# Patient Record
Sex: Female | Born: 1980
Health system: Southern US, Community
[De-identification: ages and names within clinical notes are randomized; demographics above are authoritative.]

## PROBLEM LIST (undated history)

## (undated) DIAGNOSIS — R87629 Unspecified abnormal cytological findings in specimens from vagina: Secondary | ICD-10-CM

## (undated) DIAGNOSIS — N6320 Unspecified lump in the left breast, unspecified quadrant: Secondary | ICD-10-CM

## (undated) DIAGNOSIS — J302 Other seasonal allergic rhinitis: Secondary | ICD-10-CM

## (undated) DIAGNOSIS — R002 Palpitations: Secondary | ICD-10-CM

## (undated) DIAGNOSIS — F32A Depression, unspecified: Secondary | ICD-10-CM

## (undated) DIAGNOSIS — N809 Endometriosis, unspecified: Secondary | ICD-10-CM

## (undated) DIAGNOSIS — R931 Abnormal findings on diagnostic imaging of heart and coronary circulation: Secondary | ICD-10-CM

## (undated) DIAGNOSIS — R000 Tachycardia, unspecified: Secondary | ICD-10-CM

## (undated) DIAGNOSIS — F410 Panic disorder [episodic paroxysmal anxiety] without agoraphobia: Secondary | ICD-10-CM

## (undated) DIAGNOSIS — F329 Major depressive disorder, single episode, unspecified: Secondary | ICD-10-CM

## (undated) DIAGNOSIS — IMO0002 Reserved for concepts with insufficient information to code with codable children: Secondary | ICD-10-CM

## (undated) DIAGNOSIS — R87619 Unspecified abnormal cytological findings in specimens from cervix uteri: Secondary | ICD-10-CM

## (undated) HISTORY — DX: Tachycardia, unspecified: R00.0

## (undated) HISTORY — DX: Other seasonal allergic rhinitis: J30.2

## (undated) HISTORY — DX: Reserved for concepts with insufficient information to code with codable children: IMO0002

## (undated) HISTORY — DX: Unspecified abnormal cytological findings in specimens from cervix uteri: R87.619

## (undated) HISTORY — DX: Unspecified lump in the left breast, unspecified quadrant: N63.20

## (undated) HISTORY — DX: Major depressive disorder, single episode, unspecified: F32.9

## (undated) HISTORY — DX: Abnormal findings on diagnostic imaging of heart and coronary circulation: R93.1

## (undated) HISTORY — DX: Palpitations: R00.2

## (undated) HISTORY — DX: Depression, unspecified: F32.A

## (undated) HISTORY — DX: Endometriosis, unspecified: N80.9

## (undated) HISTORY — DX: Unspecified abnormal cytological findings in specimens from vagina: R87.629

## (undated) HISTORY — DX: Panic disorder (episodic paroxysmal anxiety): F41.0

---
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
## 2004-09-21 ENCOUNTER — Ambulatory Visit: Payer: Self-pay | Admitting: Family Medicine

## 2004-10-29 ENCOUNTER — Ambulatory Visit: Payer: Self-pay | Admitting: Family Medicine

## 2005-05-22 ENCOUNTER — Other Ambulatory Visit: Admission: RE | Admit: 2005-05-22 | Discharge: 2005-05-22 | Payer: Self-pay | Admitting: Internal Medicine

## 2005-05-22 ENCOUNTER — Encounter (INDEPENDENT_AMBULATORY_CARE_PROVIDER_SITE_OTHER): Payer: Self-pay | Admitting: *Deleted

## 2005-05-22 ENCOUNTER — Ambulatory Visit: Payer: Self-pay | Admitting: Family Medicine

## 2005-11-01 ENCOUNTER — Ambulatory Visit: Payer: Self-pay | Admitting: Family Medicine

## 2007-01-08 HISTORY — PX: LAPAROSCOPIC ENDOMETRIOSIS FULGURATION: SUR769

## 2007-03-12 ENCOUNTER — Ambulatory Visit: Payer: Self-pay | Admitting: Family Medicine

## 2007-03-12 DIAGNOSIS — H1030 Unspecified acute conjunctivitis, unspecified eye: Secondary | ICD-10-CM | POA: Insufficient documentation

## 2007-06-02 ENCOUNTER — Ambulatory Visit (HOSPITAL_COMMUNITY): Admission: RE | Admit: 2007-06-02 | Discharge: 2007-06-02 | Payer: Self-pay | Admitting: Obstetrics and Gynecology

## 2008-10-03 ENCOUNTER — Encounter: Payer: Self-pay | Admitting: Cardiology

## 2008-10-05 ENCOUNTER — Other Ambulatory Visit: Admission: RE | Admit: 2008-10-05 | Discharge: 2008-10-05 | Payer: Self-pay | Admitting: Obstetrics and Gynecology

## 2008-11-14 ENCOUNTER — Encounter: Payer: Self-pay | Admitting: Cardiology

## 2008-12-07 DIAGNOSIS — R931 Abnormal findings on diagnostic imaging of heart and coronary circulation: Secondary | ICD-10-CM

## 2008-12-07 HISTORY — DX: Abnormal findings on diagnostic imaging of heart and coronary circulation: R93.1

## 2008-12-28 ENCOUNTER — Ambulatory Visit: Payer: Self-pay | Admitting: Cardiology

## 2008-12-28 DIAGNOSIS — F411 Generalized anxiety disorder: Secondary | ICD-10-CM | POA: Insufficient documentation

## 2008-12-28 DIAGNOSIS — R002 Palpitations: Secondary | ICD-10-CM | POA: Insufficient documentation

## 2008-12-28 DIAGNOSIS — F17200 Nicotine dependence, unspecified, uncomplicated: Secondary | ICD-10-CM | POA: Insufficient documentation

## 2009-01-03 ENCOUNTER — Ambulatory Visit: Payer: Self-pay | Admitting: Cardiology

## 2009-01-03 ENCOUNTER — Encounter: Payer: Self-pay | Admitting: Physician Assistant

## 2009-01-04 ENCOUNTER — Encounter (INDEPENDENT_AMBULATORY_CARE_PROVIDER_SITE_OTHER): Payer: Self-pay | Admitting: *Deleted

## 2009-02-03 ENCOUNTER — Encounter: Payer: Self-pay | Admitting: Physician Assistant

## 2009-02-16 ENCOUNTER — Encounter (INDEPENDENT_AMBULATORY_CARE_PROVIDER_SITE_OTHER): Payer: Self-pay | Admitting: *Deleted

## 2009-03-03 ENCOUNTER — Encounter: Payer: Self-pay | Admitting: Cardiology

## 2009-11-22 ENCOUNTER — Other Ambulatory Visit
Admission: RE | Admit: 2009-11-22 | Discharge: 2009-11-22 | Payer: Self-pay | Source: Home / Self Care | Admitting: Obstetrics and Gynecology

## 2010-01-07 HISTORY — PX: LEEP: SHX91

## 2010-01-10 ENCOUNTER — Ambulatory Visit (HOSPITAL_COMMUNITY)
Admission: RE | Admit: 2010-01-10 | Discharge: 2010-01-10 | Payer: Self-pay | Source: Home / Self Care | Attending: Obstetrics & Gynecology | Admitting: Obstetrics & Gynecology

## 2010-02-06 NOTE — Miscellaneous (Signed)
  Clinical Lists Changes  Problems: Removed problem of ATRIAL FIBRILLATION, PAROXYSMAL (ICD-427.31) Removed problem of * FLECAINIDE RX Removed problem of PACEMAKER, PERMANENT (ICD-V45.01) Observations: Added new observation of PAST MED HX: Palpitations    event recorder.Marland Kitchen.01/2009...NSR...sinus tachy Depression w/ panic attacks left breast mass EF 55-60%....echo...12/2008   (03/03/2009 11:58) Added new observation of PRIMARY MD: Donzetta Sprung, MD (03/03/2009 11:58)       Past History:  Past Medical History: Palpitations    event recorder.Marland Kitchen.01/2009...NSR...sinus tachy Depression w/ panic attacks left breast mass EF 55-60%....echo...12/2008

## 2010-02-06 NOTE — Procedures (Signed)
Summary: PDS Event Monitor-Final Summary  PDS Event Monitor-Final Summary   Imported By: Cyril Loosen, RN, BSN 02/15/2009 10:29:44  _____________________________________________________________________  External Attachment:    Type:   Image     Comment:   External Document  Appended Document: PDS Event Monitor-Final Summary Pt notified of results by letter.

## 2010-02-06 NOTE — Miscellaneous (Signed)
  Clinical Lists Changes  Problems: Added new problem of ATRIAL FIBRILLATION, PAROXYSMAL (ICD-427.31) Added new problem of * FLECAINIDE RX Added new problem of PACEMAKER, PERMANENT (ICD-V45.01) Observations: Added new observation of PAST MED HX: Palpitations Depression w/ panic attacks left breast mass Atrial fibrillation Brady-tachy Pacemaker  permanent Flecainide Rx EF 60%...echo...11/2005 Nuclear    2004...Marland Kitchenno scar or ischemia....EF   75%  (03/03/2009 11:43) Added new observation of PRIMARY MD: Donzetta Sprung, MD (03/03/2009 11:43)       Past History:  Past Medical History: Palpitations Depression w/ panic attacks left breast mass Atrial fibrillation Brady-tachy Pacemaker  permanent Flecainide Rx EF 60%...echo...11/2005 Nuclear    2004...Marland Kitchenno scar or ischemia....EF   75%

## 2010-02-06 NOTE — Letter (Signed)
Summary: Engineer, materials at [facility]  [address]   [place], [place] [address]   Phone: [phone]  Fax: [phone]        February 16, 2009 MRN: [id number]    Madison Golden [address] [place], [place]  [address]    Dear Ms. [name],  Your test ordered by Selena Batten has been reviewed by your physician (or physician assistant) and was found to be normal or stable. Your physician (or physician assistant) felt no changes were needed at this time.  ____ Echocardiogram  ____ Cardiac Stress Test  ____ Lab Work  ____ Peripheral vascular study of arms, legs or neck  ____ CT scan or X-ray  ____ Lung or Breathing test  __X__ Other: Heart Monitor   Thank you.   Cyril Loosen, RN, BSN    Duane Boston, M.D., F.A.C.C. Thressa Sheller, M.D., F.A.C.C. Oneal Grout, M.D., F.A.C.C. Cheree Ditto, M.D., F.A.C.C. Daiva Nakayama, M.D., F.A.C.C. Kenney Houseman, M.D., F.A.C.C. Jeanne Ivan, PA-C

## 2010-03-19 LAB — COMPREHENSIVE METABOLIC PANEL
ALT: 20 U/L (ref 0–35)
AST: 20 U/L (ref 0–37)
Albumin: 4 g/dL (ref 3.5–5.2)
Alkaline Phosphatase: 51 U/L (ref 39–117)
BUN: 15 mg/dL (ref 6–23)
CO2: 25 mEq/L (ref 19–32)
Calcium: 9.2 mg/dL (ref 8.4–10.5)
Chloride: 105 mEq/L (ref 96–112)
Creatinine, Ser: 0.82 mg/dL (ref 0.4–1.2)
GFR calc Af Amer: >60 mL/min (ref >60)
GFR calc non Af Amer: >60 mL/min (ref >60)
Glucose, Bld: 82 mg/dL (ref 70–99)
Potassium: 4.2 mEq/L (ref 3.5–5.1)
Sodium: 138 mEq/L (ref 135–145)
Total Bilirubin: 0.7 mg/dL (ref 0.3–1.2)
Total Protein: 6.9 g/dL (ref 6.0–8.3)

## 2010-03-19 LAB — URINALYSIS, ROUTINE W REFLEX MICROSCOPIC
Bilirubin Urine: NEGATIVE
Glucose, UA: NEGATIVE mg/dL
Hgb urine dipstick: NEGATIVE
Ketones, ur: NEGATIVE mg/dL
Nitrite: NEGATIVE
Protein, ur: NEGATIVE mg/dL
Specific Gravity, Urine: 1.03 (ref 1.005–1.030)
Urobilinogen, UA: 0.2 mg/dL (ref 0.0–1.0)
pH: 5.5 (ref 5.0–8.0)

## 2010-03-19 LAB — HCG, QUANTITATIVE, PREGNANCY: hCG, Beta Chain, Quant, S: <2 m[IU]/mL (ref <5)

## 2010-03-19 LAB — CBC
HCT: 35.5 % — ABNORMAL LOW (ref 36.0–46.0)
Hemoglobin: 12.2 g/dL (ref 12.0–15.0)
MCH: 30 pg (ref 26.0–34.0)
MCHC: 34.4 g/dL (ref 30.0–36.0)
MCV: 87.4 fL (ref 78.0–100.0)
Platelets: 178 10*3/uL (ref 150–400)
RBC: 4.06 MIL/uL (ref 3.87–5.11)
RDW: 13.2 % (ref 11.5–15.5)
WBC: 5.2 10*3/uL (ref 4.0–10.5)

## 2010-03-19 LAB — SURGICAL PCR SCREEN
MRSA, PCR: NEGATIVE
Staphylococcus aureus: NEGATIVE

## 2010-05-22 NOTE — Op Note (Signed)
NAME:  Madison Golden, Madison Golden NO.:  [id number]   MEDICAL RECORD NO.:  [id number]          PATIENT TYPE:  AMB   LOCATION:  SDC                           FACILITY:  [facility]   PHYSICIAN:  Zenaida Niece, M.D.DATE OF BIRTH:  1980-05-03   DATE OF PROCEDURE:  06/02/2007  DATE OF DISCHARGE:                               OPERATIVE REPORT   PREOPERATIVE DIAGNOSIS:  Pelvic pain.   POSTOPERATIVE DIAGNOSES:  Pelvic pain and probable endometriosis.   PROCEDURES:  Laparoscopy with fulguration of probable endometriosis.   SURGEON:  Zenaida Niece, MD   ANESTHESIA:  General endotracheal tube.   FINDINGS:  She had normal anatomy in the pelvis and upper abdomen except  for some lesions and both ovarian fossae on the left greater than the  right consistent with endometriosis.   SPECIMENS:  None.   ESTIMATED BLOOD LOSS:  Minimal.   COMPLICATIONS:  None.   PROCEDURE IN DETAIL:  The patient was taken to the operating room and  placed in the dorsal supine position.  General anesthesia was induced,  and she was placed in mobile stirrups and left arm tucked to her side.  Abdomen was prepped and draped in the usual sterile fashion, bladder  drained with a latex-free catheter and a Hulka tenaculum applied to the  cervix for uterine manipulation.  Infraumbilical skin was then  infiltrated with 0.25% Marcaine and a three-quarter centimeter vertical  incision was made.  The Veress needle was inserted into the peritoneal  cavity and placement confirmed by the water drop test and opening  pressure of 5 mmHg.  CO2 gas was insufflated to a pressure of 14 mmHg,  and the Veress needle was removed.  A 5-mm laparoscope was then  introduced with direct visualization with a disposable trocar.  A 5-mm  port was then also placed on the left side under direct visualization.  Inspection revealed the above-mentioned findings.  Appendix,  gallbladder, liver, and stomach appeared normal.  The uterus,  tubes, and  ovaries were essentially normal.  There were a couple small clear  implants in the right ovarian fossa just superior to the uterosacral  ligament.  There was a small adhesion in the left ovarian fossa and some  clear and dark lesions consistent with endometriosis.  Ureters were  identified inferior to these lesions.  All visible lesions were then  fulgurated with bipolar cautery.  No further source for her pain was  identified.  The  5-mm trocar on the left was removed under direct visualization.  All gas  was allowed to deflate from the abdomen and the umbilical trocar was  then removed.  Skin incisions were closed with interrupted subcuticular  sutures of 4-0  Vicryl followed by Dermabond.  The Hulka tenaculum was then removed.  The patient tolerated the procedure well.  She was extubated in the  operating room and taken to the recovery room in stable condition.  Counts were correct x2, and she had PAS hose on throughout the  procedure.      Zenaida Niece, M.D.  Electronically Signed     TDM/MEDQ  D:  06/02/2007  T:  06/03/2007  Job:  [id number]

## 2010-05-25 NOTE — Discharge Summary (Signed)
[facility]  Patient:    Madison Golden, Madison Golden Visit Number: [id number] MRN: [id number]          Service Type: ANT Location: [facility] Attending Physician:  Malon Kindle Dictated by:   Malachi Pro. Ambrose Mantle, M.D. Admit Date:  03/20/2001 Disc. Date: 04/09/01                             Discharge Summary  HISTORY OF PRESENT ILLNESS:   A 30 year old black female para 0, gravida 1, 37 weeks and 4 days by nine-week ultrasound with [facility] April 22, 2001, presented complaining of regular contractions.  No vaginal bleeding or ruptured membranes.  Good fetal movement.  Vaginal exam in maternity admission unit by the RN showed the cervix to be 5 to 6 cm dilated.  The patient was admitted and received an epidural.  Blood group and type O positive with a negative antibody, RPR nonreactive, rubella immune, hepatitis B surface antigen negative.  GC and chlamydia negative.  Triple screen normal.  One-hour glucola 73.  Group B strep positive.  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  Ultrasound on March 20, 2001, for size less than dates showed an estimated fetal weight of about 2400 g, AFI 11.0.  PAST MEDICAL AND SURGICAL HISTORY: Negative.  ALLERGIES:                    No known drug allergies.  MEDICATIONS:                  None.  SOCIAL HISTORY:               The patient is single.  The father of the baby was present, no tobacco.  FAMILY HISTORY:               Negative.  HOSPITAL COURSE:              On admission, she was afebrile with normal vital signs.  Fetal heart tones were reactive with contractions every 2 to 4 minutes.  Abdomen was gravid and nontender.  Estimated fetal weight about 6-1/2 pounds.  Vaginal exam by Dr. Jackelyn Knife at 7:45 a.m. showed cervix 7 cm, 80%, vertex, 0 station.  Artificial rupture of membranes produced no fluid. The patient was treated with penicillin for positive group B strep.  By 9 a.m.,  contractions every 2 to 3 minutes, cervix 9 cm, 100%, some bloody show, vertex, +1 station.  The patient progressed to full dilatation and pushed well.  She delivered spontaneously at 10:43 a.m. by Dr. Ambrose Mantle a living female infant, 5 pounds 14 ounces.  The delivery was OA.  Apgars 9 at 1 and 9 at 5 minutes.  Procedure was done over an intact perineum with bilateral labial lacerations.  The labial lacerations were hemostatic and not sutured. The placenta was intact.  Uterus was normal.  Blood loss was about 400 cc.  Postpartum, the patient did well, and on the second postpartum day declined analgesics for pain and was discharge.  Initial hemoglobin 10.7, hematocrit 31.0, white count 13,900, platelet count 196,000.  Followup hemoglobin 9.7, hematocrit 28.4, white count 13,700, platelet count 156,000.  RPR was nonreactive.  FINAL DIAGNOSIS:              1. Intrauterine pregnancy at 37+ weeks.  2. Group B Streptococcus carrier.                               3. Borderline small-for-gestational-age infant.  OPERATIONS:                   Spontaneous delivery OA.  FINAL CONDITION:              Improved.  DIET:                         Regular.  DISCHARGE INSTRUCTIONS:       No vaginal entrance.  Call with any fever above 100.4 degrees.  Call with any unusual problems.  Follow our discharge instruction booklet, and return to the office in six weeks for followup examination. Dictated by:   Malachi Pro. Ambrose Mantle, M.D. Attending Physician:  Malon Kindle DD:  04/09/01 TD:  04/09/01 Job: [id number]

## 2010-07-03 ENCOUNTER — Encounter: Payer: Self-pay | Admitting: Physician Assistant

## 2010-07-16 ENCOUNTER — Other Ambulatory Visit: Payer: Self-pay | Admitting: Obstetrics & Gynecology

## 2010-07-16 ENCOUNTER — Other Ambulatory Visit (HOSPITAL_COMMUNITY)
Admission: RE | Admit: 2010-07-16 | Discharge: 2010-07-16 | Disposition: A | Discharge status: Home / Self Care | Payer: Commercial Indemnity | Source: Ambulatory Visit | Attending: Obstetrics & Gynecology | Admitting: Obstetrics & Gynecology

## 2010-07-16 DIAGNOSIS — Z01419 Encounter for gynecological examination (general) (routine) without abnormal findings: Secondary | ICD-10-CM | POA: Insufficient documentation

## 2010-10-03 LAB — CBC
HCT: 38.5
Hemoglobin: 13.5
MCHC: 35.1
MCV: 89.4
Platelets: 162
RBC: 4.31
RDW: 12.2
WBC: 6.9

## 2010-10-03 LAB — PREGNANCY, URINE: Preg Test, Ur: NEGATIVE

## 2011-01-18 ENCOUNTER — Other Ambulatory Visit (HOSPITAL_COMMUNITY)
Admission: RE | Admit: 2011-01-18 | Discharge: 2011-01-18 | Disposition: A | Discharge status: Home / Self Care | Payer: Commercial Indemnity | Source: Ambulatory Visit | Attending: Obstetrics & Gynecology | Admitting: Obstetrics & Gynecology

## 2011-01-18 ENCOUNTER — Other Ambulatory Visit: Payer: Self-pay | Admitting: Obstetrics & Gynecology

## 2011-01-18 DIAGNOSIS — Z01419 Encounter for gynecological examination (general) (routine) without abnormal findings: Secondary | ICD-10-CM | POA: Insufficient documentation

## 2012-01-08 NOTE — L&D Delivery Note (Signed)
Delivery Note At 10:25 PM a viable female was delivered via Vaginal, Spontaneous Delivery (Presentation: ; Occiput Anterior).  APGAR: 8, 9; weight 7 lb 14.5 oz (3586 g).   Placenta status: Intact, Spontaneous.  Cord:  with the following complications: None.  Cord pH: NA  Anesthesia: Local  Episiotomy: None Lacerations: 2nd degree;Perineal Suture Repair: 3.0 vicryl Est. Blood Loss (mL): 300  Mom to postpartum.  Baby to Nursery. Placenta to: BS Feeding: Breast Circ: NA Contraception: BTL   Doyt Castellana 11/10/2012, 1:43 AM

## 2012-01-24 ENCOUNTER — Other Ambulatory Visit: Payer: Self-pay | Admitting: Adult Health

## 2012-01-24 ENCOUNTER — Other Ambulatory Visit (HOSPITAL_COMMUNITY)
Admission: RE | Admit: 2012-01-24 | Discharge: 2012-01-24 | Disposition: A | Discharge status: Home / Self Care | Payer: 59 | Source: Ambulatory Visit | Attending: Obstetrics and Gynecology | Admitting: Obstetrics and Gynecology

## 2012-01-24 DIAGNOSIS — Z1151 Encounter for screening for human papillomavirus (HPV): Secondary | ICD-10-CM | POA: Insufficient documentation

## 2012-01-24 DIAGNOSIS — Z01419 Encounter for gynecological examination (general) (routine) without abnormal findings: Secondary | ICD-10-CM | POA: Insufficient documentation

## 2012-01-24 LAB — HM PAP SMEAR: HM Pap smear: NEGATIVE

## 2012-03-16 ENCOUNTER — Other Ambulatory Visit: Payer: Self-pay | Admitting: Obstetrics & Gynecology

## 2012-03-16 DIAGNOSIS — Z1389 Encounter for screening for other disorder: Secondary | ICD-10-CM

## 2012-03-18 ENCOUNTER — Encounter: Payer: Self-pay | Admitting: *Deleted

## 2012-03-25 ENCOUNTER — Ambulatory Visit (INDEPENDENT_AMBULATORY_CARE_PROVIDER_SITE_OTHER): Payer: 59

## 2012-03-25 ENCOUNTER — Encounter: Payer: Self-pay | Admitting: Obstetrics & Gynecology

## 2012-03-25 ENCOUNTER — Other Ambulatory Visit: Payer: Self-pay | Admitting: Obstetrics & Gynecology

## 2012-03-25 DIAGNOSIS — O26849 Uterine size-date discrepancy, unspecified trimester: Secondary | ICD-10-CM

## 2012-03-25 DIAGNOSIS — Z1389 Encounter for screening for other disorder: Secondary | ICD-10-CM

## 2012-03-25 LAB — US OB COMP LESS 14 WKS

## 2012-04-01 ENCOUNTER — Encounter: Payer: Self-pay | Admitting: Adult Health

## 2012-04-01 ENCOUNTER — Other Ambulatory Visit: Payer: Self-pay | Admitting: Adult Health

## 2012-04-01 ENCOUNTER — Ambulatory Visit (INDEPENDENT_AMBULATORY_CARE_PROVIDER_SITE_OTHER): Payer: 59 | Admitting: Adult Health

## 2012-04-01 VITALS — BP 118/76 | Wt 131.0 lb

## 2012-04-01 DIAGNOSIS — O210 Mild hyperemesis gravidarum: Secondary | ICD-10-CM

## 2012-04-01 DIAGNOSIS — O34219 Maternal care for unspecified type scar from previous cesarean delivery: Secondary | ICD-10-CM

## 2012-04-01 DIAGNOSIS — Z3481 Encounter for supervision of other normal pregnancy, first trimester: Secondary | ICD-10-CM

## 2012-04-01 DIAGNOSIS — O99019 Anemia complicating pregnancy, unspecified trimester: Secondary | ICD-10-CM

## 2012-04-01 DIAGNOSIS — O3440 Maternal care for other abnormalities of cervix, unspecified trimester: Secondary | ICD-10-CM

## 2012-04-01 LAB — CBC
HCT: 35.8 % — ABNORMAL LOW (ref 36.0–46.0)
Hemoglobin: 11.9 g/dL — ABNORMAL LOW (ref 12.0–15.0)
MCH: 28.7 pg (ref 26.0–34.0)
MCHC: 33.2 g/dL (ref 30.0–36.0)
MCV: 86.5 fL (ref 78.0–100.0)
Platelets: 222 10*3/uL (ref 150–400)
RBC: 4.14 MIL/uL (ref 3.87–5.11)
RDW: 13.2 % (ref 11.5–15.5)
WBC: 8.4 10*3/uL (ref 4.0–10.5)

## 2012-04-01 LAB — POCT URINALYSIS DIPSTICK
Blood, UA: NEGATIVE
Glucose, UA: NEGATIVE
Ketones, UA: NEGATIVE
Leukocytes, UA: NEGATIVE
Nitrite, UA: NEGATIVE
Protein, UA: NEGATIVE

## 2012-04-01 NOTE — Patient Instructions (Addendum)
IT/ NT in 4 weeks, Try prunes, fruit and increase fluids for constipation. Can sign up for my chart

## 2012-04-01 NOTE — Progress Notes (Signed)
Lab consent signed. Would like to discuss VBAC and options.

## 2012-04-01 NOTE — Progress Notes (Signed)
Complains of nausea, declines meds,has constipation try prunes, raisins grapes and increase fluids.No bleeding, or discharge some cramps. Will get IT/NT in 4 weeks

## 2012-04-02 ENCOUNTER — Telehealth: Payer: Self-pay | Admitting: Adult Health

## 2012-04-02 LAB — HIV ANTIBODY (ROUTINE TESTING W REFLEX): HIV: NONREACTIVE

## 2012-04-02 LAB — RUBELLA SCREEN: Rubella: 3.66 Index — ABNORMAL HIGH (ref <0.90)

## 2012-04-02 LAB — DRUG SCREEN, URINE, NO CONFIRMATION
Amphetamine Screen, Ur: NEGATIVE
Barbiturate Quant, Ur: NEGATIVE
Benzodiazepines.: NEGATIVE
Cocaine Metabolites: NEGATIVE
Creatinine,U: 82 mg/dL
Marijuana Metabolite: NEGATIVE
Methadone: NEGATIVE
Opiate Screen, Urine: NEGATIVE
Phencyclidine (PCP): NEGATIVE
Propoxyphene: NEGATIVE

## 2012-04-02 LAB — URINALYSIS
Bilirubin Urine: NEGATIVE
Glucose, UA: NEGATIVE mg/dL
Hgb urine dipstick: NEGATIVE
Ketones, ur: NEGATIVE mg/dL
Leukocytes, UA: NEGATIVE
Nitrite: NEGATIVE
Protein, ur: NEGATIVE mg/dL
Specific Gravity, Urine: 1.015 (ref 1.005–1.030)
Urobilinogen, UA: 0.2 mg/dL (ref 0.0–1.0)
pH: 7 (ref 5.0–8.0)

## 2012-04-02 LAB — ABO AND RH: Rh Type: POSITIVE

## 2012-04-02 LAB — HEPATITIS B SURFACE ANTIGEN: Hepatitis B Surface Ag: NEGATIVE

## 2012-04-02 LAB — GC/CHLAMYDIA PROBE AMP
CT Probe RNA: NEGATIVE
GC Probe RNA: NEGATIVE

## 2012-04-02 LAB — VARICELLA ZOSTER ANTIBODY, IGG: Varicella IgG: 315.4 Index — ABNORMAL HIGH (ref <135.00)

## 2012-04-02 LAB — RPR

## 2012-04-02 LAB — SICKLE CELL SCREEN: Sickle Cell Screen: NEGATIVE

## 2012-04-02 LAB — OXYCODONE SCREEN, UA, RFLX CONFIRM: Oxycodone Screen, Ur: NEGATIVE ng/mL

## 2012-04-02 LAB — ANTIBODY SCREEN: Antibody Screen: NEGATIVE

## 2012-04-02 MED ORDER — PROMETHAZINE HCL 25 MG PO TABS: 25.0000 mg | ORAL_TABLET | Freq: Four times a day (QID) | ORAL | Status: DC | PRN

## 2012-04-02 NOTE — Telephone Encounter (Signed)
Madison Golden called stating she is having nausea and vomiting today, wants to try phenergan. It was e-prescribed to [facility]

## 2012-04-03 LAB — URINE CULTURE
Colony Count: NO GROWTH
Organism ID, Bacteria: NO GROWTH

## 2012-04-15 DIAGNOSIS — Z029 Encounter for administrative examinations, unspecified: Secondary | ICD-10-CM

## 2012-04-27 ENCOUNTER — Telehealth: Payer: Self-pay | Admitting: Adult Health

## 2012-04-27 NOTE — Telephone Encounter (Signed)
Spoke with pt. Had sex on Friday pm. Spotting on Saturday. Went to [facility] ER and was told everything was ok. Spotting was brownish/pink; still notices it just with wiping. Advised everything sounds fine at this point but to watch for bleeding to get heavier, to turn red, or cramping to start. If that happens, call us back. Advised no sex for 7 days from the last time she wiped any color. Has a scheduled appt on Wednesday, and I advised her to keep that appt. To call if any problems. Pt voiced understanding.

## 2012-04-29 ENCOUNTER — Ambulatory Visit (INDEPENDENT_AMBULATORY_CARE_PROVIDER_SITE_OTHER): Payer: 59

## 2012-04-29 ENCOUNTER — Ambulatory Visit (INDEPENDENT_AMBULATORY_CARE_PROVIDER_SITE_OTHER): Payer: 59 | Admitting: Advanced Practice Midwife

## 2012-04-29 ENCOUNTER — Other Ambulatory Visit: Payer: Self-pay | Admitting: Advanced Practice Midwife

## 2012-04-29 VITALS — BP 104/60 | Wt 127.0 lb

## 2012-04-29 DIAGNOSIS — O34219 Maternal care for unspecified type scar from previous cesarean delivery: Secondary | ICD-10-CM

## 2012-04-29 DIAGNOSIS — Z9889 Other specified postprocedural states: Secondary | ICD-10-CM

## 2012-04-29 DIAGNOSIS — Z3480 Encounter for supervision of other normal pregnancy, unspecified trimester: Secondary | ICD-10-CM

## 2012-04-29 DIAGNOSIS — O3440 Maternal care for other abnormalities of cervix, unspecified trimester: Secondary | ICD-10-CM

## 2012-04-29 DIAGNOSIS — Z36 Encounter for antenatal screening of mother: Secondary | ICD-10-CM

## 2012-04-29 DIAGNOSIS — O99019 Anemia complicating pregnancy, unspecified trimester: Secondary | ICD-10-CM

## 2012-04-29 DIAGNOSIS — Z3481 Encounter for supervision of other normal pregnancy, first trimester: Secondary | ICD-10-CM

## 2012-04-29 DIAGNOSIS — O0990 Supervision of high risk pregnancy, unspecified, unspecified trimester: Secondary | ICD-10-CM | POA: Insufficient documentation

## 2012-04-29 LAB — POCT URINALYSIS DIPSTICK
Glucose, UA: NEGATIVE
Ketones, UA: NEGATIVE
Nitrite, UA: NEGATIVE
Protein, UA: NEGATIVE

## 2012-04-29 NOTE — Assessment & Plan Note (Signed)
Wants another VBAC

## 2012-04-29 NOTE — Progress Notes (Signed)
Had NT/IT today.  See u/s note below.    No c/o at this time.  Routine questions about pregnancy answered.  F/U in 4 weeks for OBV and 2nd IT.

## 2012-04-29 NOTE — Progress Notes (Signed)
U/S-(11+6wks)-single IUP with +FCA noted CRL c/w dates, NB present, NT = 1.84mm, cx long and closed, bilateral adnexa wnl, no free fluid noted

## 2012-04-29 NOTE — Assessment & Plan Note (Signed)
Clinic:[facility]  Genetic Screen NT:                            First Screen:               Quad Screen/MSAFP:  Anatomic US   Glucose Screen   GBS   Feeding Preference   Contraception   Circumcision       

## 2012-04-29 NOTE — Progress Notes (Signed)
Brownish discharge after sex, seen at [facility] ER 04/25/2012, Pt states husband tested for Cystic Fibrosis at PCP practice and results are negative.

## 2012-05-04 LAB — MATERNAL SCREEN, INTEGRATED #1

## 2012-05-14 ENCOUNTER — Telehealth: Payer: Self-pay | Admitting: Adult Health

## 2012-05-14 NOTE — Telephone Encounter (Signed)
Spoke with pt. Having issues with constipation. [redacted] weeks pregnant. Advised to eat 4 prunes daily or drink prune juice. Also advised to increase water intake, and eat fruits and veg. Can take otc colace if needed. Advised if this don't help, call us back. Pt voiced understanding. JSY

## 2012-05-29 ENCOUNTER — Other Ambulatory Visit: Payer: Self-pay | Admitting: Obstetrics & Gynecology

## 2012-05-29 ENCOUNTER — Ambulatory Visit (INDEPENDENT_AMBULATORY_CARE_PROVIDER_SITE_OTHER): Payer: 59 | Admitting: Obstetrics & Gynecology

## 2012-05-29 ENCOUNTER — Encounter: Payer: Self-pay | Admitting: Obstetrics & Gynecology

## 2012-05-29 VITALS — BP 100/60 | Wt 131.0 lb

## 2012-05-29 DIAGNOSIS — Z1389 Encounter for screening for other disorder: Secondary | ICD-10-CM

## 2012-05-29 DIAGNOSIS — O99019 Anemia complicating pregnancy, unspecified trimester: Secondary | ICD-10-CM

## 2012-05-29 DIAGNOSIS — O34219 Maternal care for unspecified type scar from previous cesarean delivery: Secondary | ICD-10-CM

## 2012-05-29 DIAGNOSIS — O3440 Maternal care for other abnormalities of cervix, unspecified trimester: Secondary | ICD-10-CM

## 2012-05-29 DIAGNOSIS — Z331 Pregnant state, incidental: Secondary | ICD-10-CM

## 2012-05-29 DIAGNOSIS — Z3482 Encounter for supervision of other normal pregnancy, second trimester: Secondary | ICD-10-CM

## 2012-05-29 LAB — POCT URINALYSIS DIPSTICK
Blood, UA: NEGATIVE
Glucose, UA: NEGATIVE
Ketones, UA: NEGATIVE
Nitrite, UA: NEGATIVE
Protein, UA: NEGATIVE

## 2012-05-29 NOTE — Addendum Note (Signed)
Addended by: Criss Alvine on: 05/29/2012 12:19 PM   Modules accepted: Orders

## 2012-05-29 NOTE — Progress Notes (Signed)
BP weight and urine results all reviewed and noted. Patient reports good fetal movement, denies any bleeding and no rupture of membranes symptoms or regular contractions. Patient is without complaints. All questions were answered.  

## 2012-05-29 NOTE — Patient Instructions (Signed)
How a Baby Grows During Pregnancy Pregnancy begins when the female's sperm enters the female's egg. This happens in the fallopian tube and is called fertilization. The fertilized egg is called an embryo until it reaches 9 weeks from the time of fertilization. From 9 weeks until birth it is called a fetus. The fertilized egg moves down the tube into the uterus and attaches to the inside lining of the uterus.  The pregnant woman is responsible for the growth of the embryo/fetus by supplying nourishment and oxygen through the blood stream and placenta to the developing fetus. The uterus becomes larger and pops out from the abdomen more and more as the fetus develops and grows. A normal pregnancy lasts 280 days, with a range of 259 to 294 days, or 40 weeks. The pregnancy is divided up into three trimesters:  First trimester - 0 to 13 weeks.  Second trimester - 14 to 27 weeks.  Third trimester - 28 to 40 weeks. The day your baby is supposed to be born is called estimated date of confinement (EDC) or estimated date of delivery (EDD). GROWTH OF THE BABY MONTH BY MONTH 1. First Month: The fertilized egg attaches to the inside of the uterus and certain cells will form the placenta and others will develop into the fetus. The arms, legs, brain, spinal cord, lungs, and heart begin to develop. At the end of the first month the heart begins to beat. The embryo weighs less than an ounce and is  inch long. 2. Second Month: The bones can be seen, the inner ear, eye lids, hands and feet form and genitals develop. By the end of 8 weeks, all of the major organs are developing. The fetus now weighs less than an ounce and is one inch (2.54 cm) long. 3. Third Month: Teeth buds appear, all the internal organs are forming, bones and muscles begin to grow, the spine can flex and the skin is transparent. Finger and toe nails begin to form, the hands develop faster than the feet and the arms are longer than the legs at this point.  The fetus weighs a little more than an ounce (0.03 kg) and is 3 inches (8.89cm) long. 4. Fourth Month: The placenta is completely formed. The external sex organs, neck, outer ear, eyebrows, eyelids and fingernails are formed. The fetus can hear, swallow, flex its arms and legs and the kidney begins to produce urine. The skin is covered with a white waxy coating (vernix) and very thin hair (lanugo) is present. The fetus weighs 5 ounces (0.14kg) and is 6 to 7 inches (16.51cm) long. 5. Fifth Month: The fetus moves around more and can be felt for the first time (called quickening), sleeps and wakes up at times, may begin to suck its finger and the nails grow to the end of the fingers. The gallbladder is now functioning and helps to digest the nutrients, eggs are formed in the female and the testicles begin to drop down from the abdomen to the scrotum in the female. The fetus weighs  to 1 pound (0.45kg) and is 10 inches (25.4cm) long. 6. Sixth Month: The lungs are formed but the fetus does not breath yet. The eyes open, the brain develops more quickly at this time, one can detect finger and toe prints and thicker hair grows. The fetus weighs 1 to 1 pounds (0.68kg) and is 12 inches (30.48cm) long. 7. Seventh Month: The fetus can hear and respond to sounds, kicks and stretches and can sense   changes in light. The fetus weighs 2 to 2 pounds (1.13kg) and is 14 inches (35.56cm) long. 8. Eight Month: All organs and body systems are fully developed and functioning. The bones get harder, taste buds develop and can taste sweet and sour flavors and the fetus may hiccup now. Different parts of the brain are developing and the skull remains soft for the brain to grow. The fetus weighs 5 pounds (2.27kg) and is 18 inches (45.75cm) long. 9. Ninth Month: The fetus gains about a half a pound a week, the lungs are fully developed, patterns of sleep develop and the head moves down into the bottom of the uterus called vertex. If the  buttocks moves into the bottom of the uterus, it is called a breech. The fetus weighs 6 to 9 pounds (2.72 to 4.08kg) and is 20 inches (50.8cm) long. You should be informed about your pregnancy, yourself and how the baby is developing as much as possible. Being informed helps you to enjoy this experience. It also gives you the sense to feel if something is not going right and when to ask questions. Talk to your caregiver when you have questions about your baby or your own body. Document Released: 06/12/2007 Document Revised: 03/18/2011 Document Reviewed: 06/12/2007 [employer] Patient Information 2014 [employer].  

## 2012-06-04 LAB — MATERNAL SCREEN, INTEGRATED #2
AFP MoM: 0.53
AFP, Serum: 22 ng/mL
Age risk Down Syndrome: 1:700 {titer}
Calculated Gestational Age: 16.3
Crown Rump Length: 55.6 mm
Estriol Mom: 1
Estriol, Free: 0.64 ng/mL
Inhibin A Dimeric: 161 pg/mL
Inhibin A MoM: 0.88
MSS Down Syndrome: 1:2500 {titer}
MSS Trisomy 18 Risk: <1:5000 {titer}
NT MoM: 1.03
Nuchal Translucency: 1.35 mm
Number of fetuses: 1
PAPP-A MoM: 1.22
PAPP-A: 1036 ng/mL
Rish for ONTD: <1:5000 {titer}
hCG MoM: 1.4
hCG, Serum: 37.4 IU/mL

## 2012-06-11 ENCOUNTER — Ambulatory Visit (INDEPENDENT_AMBULATORY_CARE_PROVIDER_SITE_OTHER): Payer: 59 | Admitting: Obstetrics and Gynecology

## 2012-06-11 ENCOUNTER — Encounter: Payer: 59 | Admitting: Obstetrics and Gynecology

## 2012-06-11 ENCOUNTER — Telehealth: Payer: Self-pay | Admitting: Obstetrics and Gynecology

## 2012-06-11 VITALS — BP 112/70 | Wt 135.0 lb

## 2012-06-11 DIAGNOSIS — Z331 Pregnant state, incidental: Secondary | ICD-10-CM

## 2012-06-11 DIAGNOSIS — O99019 Anemia complicating pregnancy, unspecified trimester: Secondary | ICD-10-CM

## 2012-06-11 DIAGNOSIS — N86 Erosion and ectropion of cervix uteri: Secondary | ICD-10-CM

## 2012-06-11 DIAGNOSIS — O34219 Maternal care for unspecified type scar from previous cesarean delivery: Secondary | ICD-10-CM

## 2012-06-11 DIAGNOSIS — Z1389 Encounter for screening for other disorder: Secondary | ICD-10-CM

## 2012-06-11 DIAGNOSIS — O3440 Maternal care for other abnormalities of cervix, unspecified trimester: Secondary | ICD-10-CM

## 2012-06-11 LAB — POCT URINALYSIS DIPSTICK
Glucose, UA: NEGATIVE
Ketones, UA: NEGATIVE
Leukocytes, UA: NEGATIVE
Nitrite, UA: NEGATIVE
Protein, UA: NEGATIVE

## 2012-06-11 NOTE — Patient Instructions (Signed)
Notify us for any post coital bleeding or unexpected spotting with normal activities

## 2012-06-11 NOTE — Telephone Encounter (Signed)
Spoke with pt. Unable to come in at 1:45 but plans on coming in at 3:30. JSY

## 2012-06-11 NOTE — Telephone Encounter (Signed)
Spoke with pt. Having an increase in vaginal discharge, + itching. Unsure if she has a yeast infection. Offered appt today at 1:45. Pt to call back after checking with work to see if she can come today. To call me back and let me know. JSY

## 2012-06-11 NOTE — Progress Notes (Signed)
C/o yellowish discharge with itching, trace of blood in urine Speculum check

## 2012-06-12 ENCOUNTER — Telehealth: Payer: Self-pay | Admitting: Obstetrics & Gynecology

## 2012-06-12 NOTE — Telephone Encounter (Signed)
Pt states having vaginal bleeding (light) since having her cervical check yesterday for vaginal discharge, Informed pt if not resolved with 24 - 72 hours from yesterdays appt to go to [facility] or call our office. Pt asking if needed to give urine sample due to trace of blood showing on urine dipstick? Informed pt would discuss with Dr. Emelda Fear and would follow up with her. Pt verbalized understanding.

## 2012-06-26 ENCOUNTER — Other Ambulatory Visit: Payer: Self-pay | Admitting: Obstetrics & Gynecology

## 2012-06-26 ENCOUNTER — Encounter: Payer: Self-pay | Admitting: Obstetrics & Gynecology

## 2012-06-26 ENCOUNTER — Ambulatory Visit (INDEPENDENT_AMBULATORY_CARE_PROVIDER_SITE_OTHER): Payer: 59 | Admitting: Obstetrics & Gynecology

## 2012-06-26 ENCOUNTER — Ambulatory Visit (INDEPENDENT_AMBULATORY_CARE_PROVIDER_SITE_OTHER): Payer: 59

## 2012-06-26 VITALS — BP 110/80 | Wt 135.0 lb

## 2012-06-26 DIAGNOSIS — O34219 Maternal care for unspecified type scar from previous cesarean delivery: Secondary | ICD-10-CM

## 2012-06-26 DIAGNOSIS — O09299 Supervision of pregnancy with other poor reproductive or obstetric history, unspecified trimester: Secondary | ICD-10-CM

## 2012-06-26 DIAGNOSIS — O3440 Maternal care for other abnormalities of cervix, unspecified trimester: Secondary | ICD-10-CM

## 2012-06-26 DIAGNOSIS — O99019 Anemia complicating pregnancy, unspecified trimester: Secondary | ICD-10-CM

## 2012-06-26 DIAGNOSIS — Z1389 Encounter for screening for other disorder: Secondary | ICD-10-CM

## 2012-06-26 DIAGNOSIS — Z331 Pregnant state, incidental: Secondary | ICD-10-CM

## 2012-06-26 LAB — POCT URINALYSIS DIPSTICK
Blood, UA: NEGATIVE
Glucose, UA: NEGATIVE
Ketones, UA: NEGATIVE
Nitrite, UA: NEGATIVE
Protein, UA: NEGATIVE

## 2012-06-26 NOTE — Progress Notes (Signed)
Sonogram reviewed and normal. BP weight and urine results all reviewed and noted. Patient reports good fetal movement, denies any bleeding and no rupture of membranes symptoms or regular contractions. Patient is without complaints. All questions were answered.

## 2012-06-26 NOTE — Patient Instructions (Signed)
Pregnancy - Second Trimester The second trimester of pregnancy (3 to 6 months) is a period of rapid growth for you and your baby. At the end of the sixth month, your baby is about 9 inches long and weighs 1 1/2 pounds. You will begin to feel the baby move between 18 and 20 weeks of the pregnancy. This is called quickening. Weight gain is faster. A clear fluid (colostrum) may leak out of your breasts. You may feel small contractions of the womb (uterus). This is known as false labor or Braxton-Hicks contractions. This is like a practice for labor when the baby is ready to be born. Usually, the problems with morning sickness have usually passed by the end of your first trimester. Some women develop small dark blotches (called cholasma, mask of pregnancy) on their face that usually goes away after the baby is born. Exposure to the sun makes the blotches worse. Acne may also develop in some pregnant women and pregnant women who have acne, may find that it goes away. PRENATAL EXAMS  Blood work may continue to be done during prenatal exams. These tests are done to check on your health and the probable health of your baby. Blood work is used to follow your blood levels (hemoglobin). Anemia (low hemoglobin) is common during pregnancy. Iron and vitamins are given to help prevent this. You will also be checked for diabetes between 24 and 28 weeks of the pregnancy. Some of the previous blood tests may be repeated.  The size of the uterus is measured during each visit. This is to make sure that the baby is continuing to grow properly according to the dates of the pregnancy.  Your blood pressure is checked every prenatal visit. This is to make sure you are not getting toxemia.  Your urine is checked to make sure you do not have an infection, diabetes or protein in the urine.  Your weight is checked often to make sure gains are happening at the suggested rate. This is to ensure that both you and your baby are  growing normally.  Sometimes, an ultrasound is performed to confirm the proper growth and development of the baby. This is a test which bounces harmless sound waves off the baby so your caregiver can more accurately determine due dates. Sometimes, a test is done on the amniotic fluid surrounding the baby. This test is called an amniocentesis. The amniotic fluid is obtained by sticking a needle into the belly (abdomen). This is done to check the chromosomes in instances where there is a concern about possible genetic problems with the baby. It is also sometimes done near the end of pregnancy if an early delivery is required. In this case, it is done to help make sure the baby's lungs are mature enough for the baby to live outside of the womb. CHANGES OCCURING IN THE SECOND TRIMESTER OF PREGNANCY Your body goes through many changes during pregnancy. They vary from person to person. Talk to your caregiver about changes you notice that you are concerned about.  During the second trimester, you will likely have an increase in your appetite. It is normal to have cravings for certain foods. This varies from person to person and pregnancy to pregnancy.  Your lower abdomen will begin to bulge.  You may have to urinate more often because the uterus and baby are pressing on your bladder. It is also common to get more bladder infections during pregnancy. You can help this by drinking lots of fluids   and emptying your bladder before and after intercourse.  You may begin to get stretch marks on your hips, abdomen, and breasts. These are normal changes in the body during pregnancy. There are no exercises or medicines to take that prevent this change.  You may begin to develop swollen and bulging veins (varicose veins) in your legs. Wearing support hose, elevating your feet for 15 minutes, 3 to 4 times a day and limiting salt in your diet helps lessen the problem.  Heartburn may develop as the uterus grows and  pushes up against the stomach. Antacids recommended by your caregiver helps with this problem. Also, eating smaller meals 4 to 5 times a day helps.  Constipation can be treated with a stool softener or adding bulk to your diet. Drinking lots of fluids, and eating vegetables, fruits, and whole grains are helpful.  Exercising is also helpful. If you have been very active up until your pregnancy, most of these activities can be continued during your pregnancy. If you have been less active, it is helpful to start an exercise program such as walking.  Hemorrhoids may develop at the end of the second trimester. Warm sitz baths and hemorrhoid cream recommended by your caregiver helps hemorrhoid problems.  Backaches may develop during this time of your pregnancy. Avoid heavy lifting, wear low heal shoes, and practice good posture to help with backache problems.  Some pregnant women develop tingling and numbness of their hand and fingers because of swelling and tightening of ligaments in the wrist (carpel tunnel syndrome). This goes away after the baby is born.  As your breasts enlarge, you may have to get a bigger bra. Get a comfortable, cotton, support bra. Do not get a nursing bra until the last month of the pregnancy if you will be nursing the baby.  You may get a dark line from your belly button to the pubic area called the linea nigra.  You may develop rosy cheeks because of increase blood flow to the face.  You may develop spider looking lines of the face, neck, arms, and chest. These go away after the baby is born. HOME CARE INSTRUCTIONS   It is extremely important to avoid all smoking, herbs, alcohol, and unprescribed drugs during your pregnancy. These chemicals affect the formation and growth of the baby. Avoid these chemicals throughout the pregnancy to ensure the delivery of a healthy infant.  Most of your home care instructions are the same as suggested for the first trimester of your  pregnancy. Keep your caregiver's appointments. Follow your caregiver's instructions regarding medicine use, exercise, and diet.  During pregnancy, you are providing food for you and your baby. Continue to eat regular, well-balanced meals. Choose foods such as meat, fish, milk and other low fat dairy products, vegetables, fruits, and whole-grain breads and cereals. Your caregiver will tell you of the ideal weight gain.  A physical sexual relationship may be continued up until near the end of pregnancy if there are no other problems. Problems could include early (premature) leaking of amniotic fluid from the membranes, vaginal bleeding, abdominal pain, or other medical or pregnancy problems.  Exercise regularly if there are no restrictions. Check with your caregiver if you are unsure of the safety of some of your exercises. The greatest weight gain will occur in the last 2 trimesters of pregnancy. Exercise will help you:  Control your weight.  Get you in shape for labor and delivery.  Lose weight after you have the baby.  Wear   a good support or jogging bra for breast tenderness during pregnancy. This may help if worn during sleep. Pads or tissues may be used in the bra if you are leaking colostrum.  Do not use hot tubs, steam rooms or saunas throughout the pregnancy.  Wear your seat belt at all times when driving. This protects you and your baby if you are in an accident.  Avoid raw meat, uncooked cheese, cat litter boxes, and soil used by cats. These carry germs that can cause birth defects in the baby.  The second trimester is also a good time to visit your dentist for your dental health if this has not been done yet. Getting your teeth cleaned is okay. Use a soft toothbrush. Brush gently during pregnancy.  It is easier to leak urine during pregnancy. Tightening up and strengthening the pelvic muscles will help with this problem. Practice stopping your urination while you are going to the  bathroom. These are the same muscles you need to strengthen. It is also the muscles you would use as if you were trying to stop from passing gas. You can practice tightening these muscles up 10 times a set and repeating this about 3 times per day. Once you know what muscles to tighten up, do not perform these exercises during urination. It is more likely to contribute to an infection by backing up the urine.  Ask for help if you have financial, counseling, or nutritional needs during pregnancy. Your caregiver will be able to offer counseling for these needs as well as refer you for other special needs.  Your skin may become oily. If so, wash your face with mild soap, use non-greasy moisturizer and oil or cream based makeup. MEDICINES AND DRUG USE IN PREGNANCY  Take prenatal vitamins as directed. The vitamin should contain 1 milligram of folic acid. Keep all vitamins out of reach of children. Only a couple vitamins or tablets containing iron may be fatal to a baby or young child when ingested.  Avoid use of all medicines, including herbs, over-the-counter medicines, not prescribed or suggested by your caregiver. Only take over-the-counter or prescription medicines for pain, discomfort, or fever as directed by your caregiver. Do not use aspirin.  Let your caregiver also know about herbs you may be using.  Alcohol is related to a number of birth defects. This includes fetal alcohol syndrome. All alcohol, in any form, should be avoided completely. Smoking will cause low birth rate and premature babies.  Street or illegal drugs are very harmful to the baby. They are absolutely forbidden. A baby born to an addicted mother will be addicted at birth. The baby will go through the same withdrawal an adult does. SEEK MEDICAL CARE IF:  You have any concerns or worries during your pregnancy. It is better to call with your questions if you feel they cannot wait, rather than worry about them. SEEK IMMEDIATE  MEDICAL CARE IF:   An unexplained oral temperature above 102 F (38.9 C) develops, or as your caregiver suggests.  You have leaking of fluid from the vagina (birth canal). If leaking membranes are suspected, take your temperature and tell your caregiver of this when you call.  There is vaginal spotting, bleeding, or passing clots. Tell your caregiver of the amount and how many pads are used. Light spotting in pregnancy is common, especially following intercourse.  You develop a bad smelling vaginal discharge with a change in the color from clear to white.  You continue to feel   sick to your stomach (nauseated) and have no relief from remedies suggested. You vomit blood or coffee ground-like materials.  You lose more than 2 pounds of weight or gain more than 2 pounds of weight over 1 week, or as suggested by your caregiver.  You notice swelling of your face, hands, feet, or legs.  You get exposed to German measles and have never had them.  You are exposed to fifth disease or chickenpox.  You develop belly (abdominal) pain. Round ligament discomfort is a common non-cancerous (benign) cause of abdominal pain in pregnancy. Your caregiver still must evaluate you.  You develop a bad headache that does not go away.  You develop fever, diarrhea, pain with urination, or shortness of breath.  You develop visual problems, blurry, or double vision.  You fall or are in a car accident or any kind of trauma.  There is mental or physical violence at home. Document Released: 12/18/2000 Document Revised: 09/18/2011 Document Reviewed: 06/22/2008 [employer] Patient Information 2014 [employer].  

## 2012-06-26 NOTE — Progress Notes (Signed)
U/S-20+1wks-active fetus, meas c/w dates, fluid WNL, Post Gr 0 plac., female fetus, anat screen complete, EICF NOTED LT VENTRICLE, NO OTHER SOFT MARKER NOTED, ALL OTHER ANAT APPEARS NML.

## 2012-07-21 ENCOUNTER — Encounter: Payer: Self-pay | Admitting: Obstetrics and Gynecology

## 2012-07-21 ENCOUNTER — Encounter: Payer: Self-pay | Admitting: Women's Health

## 2012-07-21 ENCOUNTER — Ambulatory Visit (INDEPENDENT_AMBULATORY_CARE_PROVIDER_SITE_OTHER): Payer: 59 | Admitting: Women's Health

## 2012-07-21 VITALS — BP 114/70 | Wt 143.2 lb

## 2012-07-21 DIAGNOSIS — Z1389 Encounter for screening for other disorder: Secondary | ICD-10-CM

## 2012-07-21 DIAGNOSIS — O09899 Supervision of other high risk pregnancies, unspecified trimester: Secondary | ICD-10-CM | POA: Insufficient documentation

## 2012-07-21 DIAGNOSIS — O09892 Supervision of other high risk pregnancies, second trimester: Secondary | ICD-10-CM

## 2012-07-21 DIAGNOSIS — Z9889 Other specified postprocedural states: Secondary | ICD-10-CM

## 2012-07-21 DIAGNOSIS — Z3482 Encounter for supervision of other normal pregnancy, second trimester: Secondary | ICD-10-CM

## 2012-07-21 DIAGNOSIS — O34219 Maternal care for unspecified type scar from previous cesarean delivery: Secondary | ICD-10-CM

## 2012-07-21 DIAGNOSIS — Z331 Pregnant state, incidental: Secondary | ICD-10-CM

## 2012-07-21 DIAGNOSIS — O3442 Maternal care for other abnormalities of cervix, second trimester: Secondary | ICD-10-CM

## 2012-07-21 DIAGNOSIS — O3440 Maternal care for other abnormalities of cervix, unspecified trimester: Secondary | ICD-10-CM

## 2012-07-21 DIAGNOSIS — O09219 Supervision of pregnancy with history of pre-term labor, unspecified trimester: Secondary | ICD-10-CM

## 2012-07-21 LAB — POCT URINALYSIS DIPSTICK
Blood, UA: NEGATIVE
Glucose, UA: NEGATIVE
Ketones, UA: NEGATIVE
Nitrite, UA: NEGATIVE
Protein, UA: NEGATIVE

## 2012-07-21 MED ORDER — HYDROXYPROGESTERONE CAPROATE 250 MG/ML IM OIL
250.0000 mg | TOPICAL_OIL | Freq: Once | INTRAMUSCULAR | Status: AC
  Administered 2012-07-21: 250 mg via INTRAMUSCULAR

## 2012-07-21 NOTE — Progress Notes (Signed)
Reports good fm. Denies uc's, lof, vb, urinary frequency, urgency, hesitancy, or dysuria.  No complaints.  Reviewed h/o 36wk pprom w/ PLTCS for breech, 17P offered and accepted, 1st dose given today, pt's supply ordered.  Pt interested in VBAC, had 1 SVD prior to c/s, consent given to take home and review. Thinking about BTL, counseled, will let us know when she decides.  All questions answered. F/U in 4wks for u/s f/u Lt EICF, PN2, and visit.

## 2012-07-21 NOTE — Patient Instructions (Signed)
You will have your sugar test next visit.  Please do not eat or drink anything after midnight the night before you come, not even water.  You will be here for at least two hours.    Pregnancy - Second Trimester The second trimester of pregnancy (3 to 6 months) is a period of rapid growth for you and your baby. At the end of the sixth month, your baby is about 9 inches long and weighs 1 1/2 pounds. You will begin to feel the baby move between 18 and 20 weeks of the pregnancy. This is called quickening. Weight gain is faster. A clear fluid (colostrum) may leak out of your breasts. You may feel small contractions of the womb (uterus). This is known as false labor or Braxton-Hicks contractions. This is like a practice for labor when the baby is ready to be born. Usually, the problems with morning sickness have usually passed by the end of your first trimester. Some women develop small dark blotches (called cholasma, mask of pregnancy) on their face that usually goes away after the baby is born. Exposure to the sun makes the blotches worse. Acne may also develop in some pregnant women and pregnant women who have acne, may find that it goes away. PRENATAL EXAMS  Blood work may continue to be done during prenatal exams. These tests are done to check on your health and the probable health of your baby. Blood work is used to follow your blood levels (hemoglobin). Anemia (low hemoglobin) is common during pregnancy. Iron and vitamins are given to help prevent this. You will also be checked for diabetes between 24 and 28 weeks of the pregnancy. Some of the previous blood tests may be repeated.  The size of the uterus is measured during each visit. This is to make sure that the baby is continuing to grow properly according to the dates of the pregnancy.  Your blood pressure is checked every prenatal visit. This is to make sure you are not getting toxemia.  Your urine is checked to make sure you do not have an  infection, diabetes or protein in the urine.  Your weight is checked often to make sure gains are happening at the suggested rate. This is to ensure that both you and your baby are growing normally.  Sometimes, an ultrasound is performed to confirm the proper growth and development of the baby. This is a test which bounces harmless sound waves off the baby so your caregiver can more accurately determine due dates. Sometimes, a test is done on the amniotic fluid surrounding the baby. This test is called an amniocentesis. The amniotic fluid is obtained by sticking a needle into the belly (abdomen). This is done to check the chromosomes in instances where there is a concern about possible genetic problems with the baby. It is also sometimes done near the end of pregnancy if an early delivery is required. In this case, it is done to help make sure the baby's lungs are mature enough for the baby to live outside of the womb. CHANGES OCCURING IN THE SECOND TRIMESTER OF PREGNANCY Your body goes through many changes during pregnancy. They vary from person to person. Talk to your caregiver about changes you notice that you are concerned about.  During the second trimester, you will likely have an increase in your appetite. It is normal to have cravings for certain foods. This varies from person to person and pregnancy to pregnancy.  Your lower abdomen will begin to   bulge.  You may have to urinate more often because the uterus and baby are pressing on your bladder. It is also common to get more bladder infections during pregnancy. You can help this by drinking lots of fluids and emptying your bladder before and after intercourse.  You may begin to get stretch marks on your hips, abdomen, and breasts. These are normal changes in the body during pregnancy. There are no exercises or medicines to take that prevent this change.  You may begin to develop swollen and bulging veins (varicose veins) in your legs.  Wearing support hose, elevating your feet for 15 minutes, 3 to 4 times a day and limiting salt in your diet helps lessen the problem.  Heartburn may develop as the uterus grows and pushes up against the stomach. Antacids recommended by your caregiver helps with this problem. Also, eating smaller meals 4 to 5 times a day helps.  Constipation can be treated with a stool softener or adding bulk to your diet. Drinking lots of fluids, and eating vegetables, fruits, and whole grains are helpful.  Exercising is also helpful. If you have been very active up until your pregnancy, most of these activities can be continued during your pregnancy. If you have been less active, it is helpful to start an exercise program such as walking.  Hemorrhoids may develop at the end of the second trimester. Warm sitz baths and hemorrhoid cream recommended by your caregiver helps hemorrhoid problems.  Backaches may develop during this time of your pregnancy. Avoid heavy lifting, wear low heal shoes, and practice good posture to help with backache problems.  Some pregnant women develop tingling and numbness of their hand and fingers because of swelling and tightening of ligaments in the wrist (carpel tunnel syndrome). This goes away after the baby is born.  As your breasts enlarge, you may have to get a bigger bra. Get a comfortable, cotton, support bra. Do not get a nursing bra until the last month of the pregnancy if you will be nursing the baby.  You may get a dark line from your belly button to the pubic area called the linea nigra.  You may develop rosy cheeks because of increase blood flow to the face.  You may develop spider looking lines of the face, neck, arms, and chest. These go away after the baby is born. HOME CARE INSTRUCTIONS   It is extremely important to avoid all smoking, herbs, alcohol, and unprescribed drugs during your pregnancy. These chemicals affect the formation and growth of the baby. Avoid  these chemicals throughout the pregnancy to ensure the delivery of a healthy infant.  Most of your home care instructions are the same as suggested for the first trimester of your pregnancy. Keep your caregiver's appointments. Follow your caregiver's instructions regarding medicine use, exercise, and diet.  During pregnancy, you are providing food for you and your baby. Continue to eat regular, well-balanced meals. Choose foods such as meat, fish, milk and other low fat dairy products, vegetables, fruits, and whole-grain breads and cereals. Your caregiver will tell you of the ideal weight gain.  A physical sexual relationship may be continued up until near the end of pregnancy if there are no other problems. Problems could include early (premature) leaking of amniotic fluid from the membranes, vaginal bleeding, abdominal pain, or other medical or pregnancy problems.  Exercise regularly if there are no restrictions. Check with your caregiver if you are unsure of the safety of some of your exercises. The   greatest weight gain will occur in the last 2 trimesters of pregnancy. Exercise will help you:  Control your weight.  Get you in shape for labor and delivery.  Lose weight after you have the baby.  Wear a good support or jogging bra for breast tenderness during pregnancy. This may help if worn during sleep. Pads or tissues may be used in the bra if you are leaking colostrum.  Do not use hot tubs, steam rooms or saunas throughout the pregnancy.  Wear your seat belt at all times when driving. This protects you and your baby if you are in an accident.  Avoid raw meat, uncooked cheese, cat litter boxes, and soil used by cats. These carry germs that can cause birth defects in the baby.  The second trimester is also a good time to visit your dentist for your dental health if this has not been done yet. Getting your teeth cleaned is okay. Use a soft toothbrush. Brush gently during pregnancy.  It is  easier to leak urine during pregnancy. Tightening up and strengthening the pelvic muscles will help with this problem. Practice stopping your urination while you are going to the bathroom. These are the same muscles you need to strengthen. It is also the muscles you would use as if you were trying to stop from passing gas. You can practice tightening these muscles up 10 times a set and repeating this about 3 times per day. Once you know what muscles to tighten up, do not perform these exercises during urination. It is more likely to contribute to an infection by backing up the urine.  Ask for help if you have financial, counseling, or nutritional needs during pregnancy. Your caregiver will be able to offer counseling for these needs as well as refer you for other special needs.  Your skin may become oily. If so, wash your face with mild soap, use non-greasy moisturizer and oil or cream based makeup. MEDICINES AND DRUG USE IN PREGNANCY  Take prenatal vitamins as directed. The vitamin should contain 1 milligram of folic acid. Keep all vitamins out of reach of children. Only a couple vitamins or tablets containing iron may be fatal to a baby or young child when ingested.  Avoid use of all medicines, including herbs, over-the-counter medicines, not prescribed or suggested by your caregiver. Only take over-the-counter or prescription medicines for pain, discomfort, or fever as directed by your caregiver. Do not use aspirin.  Let your caregiver also know about herbs you may be using.  Alcohol is related to a number of birth defects. This includes fetal alcohol syndrome. All alcohol, in any form, should be avoided completely. Smoking will cause low birth rate and premature babies.  Street or illegal drugs are very harmful to the baby. They are absolutely forbidden. A baby born to an addicted mother will be addicted at birth. The baby will go through the same withdrawal an adult does. SEEK MEDICAL CARE IF:    You have any concerns or worries during your pregnancy. It is better to call with your questions if you feel they cannot wait, rather than worry about them. SEEK IMMEDIATE MEDICAL CARE IF:   An unexplained oral temperature above 102 F (38.9 C) develops, or as your caregiver suggests.  You have leaking of fluid from the vagina (birth canal). If leaking membranes are suspected, take your temperature and tell your caregiver of this when you call.  There is vaginal spotting, bleeding, or passing clots. Tell your caregiver of   the amount and how many pads are used. Light spotting in pregnancy is common, especially following intercourse.  You develop a bad smelling vaginal discharge with a change in the color from clear to white.  You continue to feel sick to your stomach (nauseated) and have no relief from remedies suggested. You vomit blood or coffee ground-like materials.  You lose more than 2 pounds of weight or gain more than 2 pounds of weight over 1 week, or as suggested by your caregiver.  You notice swelling of your face, hands, feet, or legs.  You get exposed to German measles and have never had them.  You are exposed to fifth disease or chickenpox.  You develop belly (abdominal) pain. Round ligament discomfort is a common non-cancerous (benign) cause of abdominal pain in pregnancy. Your caregiver still must evaluate you.  You develop a bad headache that does not go away.  You develop fever, diarrhea, pain with urination, or shortness of breath.  You develop visual problems, blurry, or double vision.  You fall or are in a car accident or any kind of trauma.  There is mental or physical violence at home. Document Released: 12/18/2000 Document Revised: 09/18/2011 Document Reviewed: 06/22/2008 [employer] Patient Information 2014 [employer].  

## 2012-07-22 ENCOUNTER — Telehealth: Payer: Self-pay | Admitting: Obstetrics & Gynecology

## 2012-07-22 NOTE — Telephone Encounter (Signed)
See telephone documentation

## 2012-07-22 NOTE — Telephone Encounter (Signed)
Spoke with pt. Has a repeat US at 28 weeks due to shadowing on baby's heart. Pt had multiple questions. Augusto Gamble, [place] tech, to call pt and discuss. JSY

## 2012-07-29 ENCOUNTER — Encounter: Payer: Self-pay | Admitting: Adult Health

## 2012-07-29 ENCOUNTER — Ambulatory Visit (INDEPENDENT_AMBULATORY_CARE_PROVIDER_SITE_OTHER): Payer: 59 | Admitting: Adult Health

## 2012-07-29 VITALS — BP 110/68 | Wt 145.0 lb

## 2012-07-29 DIAGNOSIS — O3440 Maternal care for other abnormalities of cervix, unspecified trimester: Secondary | ICD-10-CM

## 2012-07-29 DIAGNOSIS — Z8751 Personal history of pre-term labor: Secondary | ICD-10-CM

## 2012-07-29 DIAGNOSIS — O09219 Supervision of pregnancy with history of pre-term labor, unspecified trimester: Secondary | ICD-10-CM

## 2012-07-29 DIAGNOSIS — O34219 Maternal care for unspecified type scar from previous cesarean delivery: Secondary | ICD-10-CM

## 2012-07-29 DIAGNOSIS — Z331 Pregnant state, incidental: Secondary | ICD-10-CM

## 2012-07-29 DIAGNOSIS — Z1389 Encounter for screening for other disorder: Secondary | ICD-10-CM

## 2012-07-29 DIAGNOSIS — O09899 Supervision of other high risk pregnancies, unspecified trimester: Secondary | ICD-10-CM

## 2012-07-29 LAB — POCT URINALYSIS DIPSTICK
Blood, UA: NEGATIVE
Glucose, UA: NEGATIVE
Ketones, UA: NEGATIVE
Nitrite, UA: NEGATIVE
Protein, UA: NEGATIVE

## 2012-07-29 MED ORDER — HYDROXYPROGESTERONE CAPROATE 250 MG/ML IM OIL
250.0000 mg | TOPICAL_OIL | Freq: Once | INTRAMUSCULAR | Status: AC
  Administered 2012-07-29: 250 mg via INTRAMUSCULAR

## 2012-08-06 ENCOUNTER — Encounter: Payer: Self-pay | Admitting: Adult Health

## 2012-08-06 ENCOUNTER — Ambulatory Visit (INDEPENDENT_AMBULATORY_CARE_PROVIDER_SITE_OTHER): Payer: 59 | Admitting: Adult Health

## 2012-08-06 VITALS — BP 100/60 | Ht 63.0 in | Wt 145.0 lb

## 2012-08-06 DIAGNOSIS — O09892 Supervision of other high risk pregnancies, second trimester: Secondary | ICD-10-CM

## 2012-08-06 DIAGNOSIS — O09219 Supervision of pregnancy with history of pre-term labor, unspecified trimester: Secondary | ICD-10-CM

## 2012-08-06 DIAGNOSIS — Z1389 Encounter for screening for other disorder: Secondary | ICD-10-CM

## 2012-08-06 DIAGNOSIS — O34219 Maternal care for unspecified type scar from previous cesarean delivery: Secondary | ICD-10-CM

## 2012-08-06 DIAGNOSIS — Z9889 Other specified postprocedural states: Secondary | ICD-10-CM

## 2012-08-06 DIAGNOSIS — O3442 Maternal care for other abnormalities of cervix, second trimester: Secondary | ICD-10-CM

## 2012-08-06 LAB — POCT URINALYSIS DIPSTICK
Blood, UA: NEGATIVE
Glucose, UA: NEGATIVE
Ketones, UA: NEGATIVE
Nitrite, UA: NEGATIVE
Protein, UA: NEGATIVE

## 2012-08-06 MED ORDER — HYDROXYPROGESTERONE CAPROATE 250 MG/ML IM OIL
250.0000 mg | TOPICAL_OIL | Freq: Once | INTRAMUSCULAR | Status: AC
  Administered 2012-08-06: 250 mg via INTRAMUSCULAR

## 2012-08-06 NOTE — Progress Notes (Signed)
Patient ID: Madison Golden, female   DOB: [birth year], 32 y.o.   MRN: [id number] Pt here today for 17P injection only. Pt states she has good fetal movement. Pt denies any bleeding, swelling or concerns at this time.

## 2012-08-13 ENCOUNTER — Ambulatory Visit: Payer: 59

## 2012-08-13 ENCOUNTER — Encounter: Payer: Self-pay | Admitting: Obstetrics & Gynecology

## 2012-08-13 ENCOUNTER — Ambulatory Visit (INDEPENDENT_AMBULATORY_CARE_PROVIDER_SITE_OTHER): Payer: 59 | Admitting: Obstetrics & Gynecology

## 2012-08-13 VITALS — BP 108/64 | Ht 63.0 in | Wt 146.5 lb

## 2012-08-13 DIAGNOSIS — O09219 Supervision of pregnancy with history of pre-term labor, unspecified trimester: Secondary | ICD-10-CM

## 2012-08-13 DIAGNOSIS — Z1389 Encounter for screening for other disorder: Secondary | ICD-10-CM

## 2012-08-13 DIAGNOSIS — Z331 Pregnant state, incidental: Secondary | ICD-10-CM

## 2012-08-13 LAB — POCT URINALYSIS DIPSTICK
Blood, UA: NEGATIVE
Glucose, UA: NEGATIVE
Ketones, UA: NEGATIVE
Leukocytes, UA: NEGATIVE
Nitrite, UA: NEGATIVE
Protein, UA: NEGATIVE

## 2012-08-13 MED ORDER — HYDROXYPROGESTERONE CAPROATE 250 MG/ML IM OIL
250.0000 mg | TOPICAL_OIL | Freq: Once | INTRAMUSCULAR | Status: AC
  Administered 2012-08-13: 250 mg via INTRAMUSCULAR

## 2012-08-13 NOTE — Progress Notes (Signed)
Pt here for 17P injection. Reports no complaints at this time.

## 2012-08-19 ENCOUNTER — Other Ambulatory Visit: Payer: 59

## 2012-08-19 ENCOUNTER — Ambulatory Visit (INDEPENDENT_AMBULATORY_CARE_PROVIDER_SITE_OTHER): Payer: 59

## 2012-08-19 ENCOUNTER — Ambulatory Visit (INDEPENDENT_AMBULATORY_CARE_PROVIDER_SITE_OTHER): Payer: 59 | Admitting: Obstetrics and Gynecology

## 2012-08-19 ENCOUNTER — Encounter: Payer: Self-pay | Admitting: Obstetrics and Gynecology

## 2012-08-19 VITALS — BP 100/72 | Wt 146.0 lb

## 2012-08-19 DIAGNOSIS — Z1389 Encounter for screening for other disorder: Secondary | ICD-10-CM

## 2012-08-19 DIAGNOSIS — O34219 Maternal care for unspecified type scar from previous cesarean delivery: Secondary | ICD-10-CM

## 2012-08-19 DIAGNOSIS — O3440 Maternal care for other abnormalities of cervix, unspecified trimester: Secondary | ICD-10-CM

## 2012-08-19 DIAGNOSIS — Z3482 Encounter for supervision of other normal pregnancy, second trimester: Secondary | ICD-10-CM

## 2012-08-19 DIAGNOSIS — O358XX0 Maternal care for other (suspected) fetal abnormality and damage, not applicable or unspecified: Secondary | ICD-10-CM

## 2012-08-19 DIAGNOSIS — O09219 Supervision of pregnancy with history of pre-term labor, unspecified trimester: Secondary | ICD-10-CM

## 2012-08-19 LAB — POCT URINALYSIS DIPSTICK
Glucose, UA: NEGATIVE
Ketones, UA: NEGATIVE
Leukocytes, UA: NEGATIVE
Nitrite, UA: NEGATIVE
Protein, UA: NEGATIVE

## 2012-08-19 LAB — CBC
HCT: 32.1 % — ABNORMAL LOW (ref 36.0–46.0)
Hemoglobin: 10.8 g/dL — ABNORMAL LOW (ref 12.0–15.0)
MCH: 30.3 pg (ref 26.0–34.0)
MCHC: 33.6 g/dL (ref 30.0–36.0)
MCV: 90.2 fL (ref 78.0–100.0)
Platelets: 187 10*3/uL (ref 150–400)
RBC: 3.56 MIL/uL — ABNORMAL LOW (ref 3.87–5.11)
RDW: 13.5 % (ref 11.5–15.5)
WBC: 9.6 10*3/uL (ref 4.0–10.5)

## 2012-08-19 NOTE — Progress Notes (Signed)
Madison Golden here today for routine visit, PN2 and [place]. Madison Golden denies any problems or concerns at this time.  U/s reviewed. Resolution of EICF. VBAC addressed in detail, including risks, benefits.:  Desired by Madison Golden. Consent given to Madison Golden to review.will sign if Madison Golden desires VBAC at f/u visit.

## 2012-08-19 NOTE — Progress Notes (Signed)
U/S(27+6wks)-breech active fetus, approp growth EFW 2 lb 13 oz 66th%tile, fluid wnl, post/fundal gr 0 plac, LT vent EICF resolved, all other anatomy reviewed no major abnl noted, female fetus "Madison Golden"

## 2012-08-20 ENCOUNTER — Ambulatory Visit (INDEPENDENT_AMBULATORY_CARE_PROVIDER_SITE_OTHER): Payer: 59 | Admitting: Adult Health

## 2012-08-20 ENCOUNTER — Encounter: Payer: Self-pay | Admitting: Adult Health

## 2012-08-20 DIAGNOSIS — O09892 Supervision of other high risk pregnancies, second trimester: Secondary | ICD-10-CM

## 2012-08-20 DIAGNOSIS — Z1389 Encounter for screening for other disorder: Secondary | ICD-10-CM

## 2012-08-20 DIAGNOSIS — O09219 Supervision of pregnancy with history of pre-term labor, unspecified trimester: Secondary | ICD-10-CM

## 2012-08-20 LAB — POCT URINALYSIS DIPSTICK
Blood, UA: NEGATIVE
Glucose, UA: NEGATIVE
Ketones, UA: NEGATIVE
Leukocytes, UA: NEGATIVE
Nitrite, UA: NEGATIVE
Protein, UA: NEGATIVE

## 2012-08-20 LAB — GLUCOSE TOLERANCE, 2 HOURS W/ 1HR
Glucose, 1 hour: 146 mg/dL (ref 70–170)
Glucose, 2 hour: 130 mg/dL (ref 70–139)
Glucose, Fasting: 72 mg/dL (ref 70–99)

## 2012-08-20 LAB — HSV 2 ANTIBODY, IGG: HSV 2 Glycoprotein G Ab, IgG: <0.1 IV

## 2012-08-20 LAB — HIV ANTIBODY (ROUTINE TESTING W REFLEX): HIV: NONREACTIVE

## 2012-08-20 LAB — ANTIBODY SCREEN: Antibody Screen: NEGATIVE

## 2012-08-20 LAB — RPR

## 2012-08-20 MED ORDER — HYDROXYPROGESTERONE CAPROATE 250 MG/ML IM OIL
250.0000 mg | TOPICAL_OIL | Freq: Once | INTRAMUSCULAR | Status: AC
  Administered 2012-08-20: 250 mg via INTRAMUSCULAR

## 2012-08-20 NOTE — Progress Notes (Signed)
Patient ID: Madison Golden, female   DOB: 12-Aug-1980, 32 y.o.   MRN: [id number] Pt denies any bleeding, swelling, or problems at this time. Pt states she has good fetal movement. Here today for 17P injection only.

## 2012-08-31 ENCOUNTER — Ambulatory Visit (INDEPENDENT_AMBULATORY_CARE_PROVIDER_SITE_OTHER): Payer: 59 | Admitting: Adult Health

## 2012-08-31 ENCOUNTER — Encounter: Payer: Self-pay | Admitting: Adult Health

## 2012-08-31 VITALS — BP 118/70 | Ht 63.0 in | Wt 152.0 lb

## 2012-08-31 DIAGNOSIS — O34219 Maternal care for unspecified type scar from previous cesarean delivery: Secondary | ICD-10-CM

## 2012-08-31 DIAGNOSIS — O09219 Supervision of pregnancy with history of pre-term labor, unspecified trimester: Secondary | ICD-10-CM

## 2012-08-31 DIAGNOSIS — O09893 Supervision of other high risk pregnancies, third trimester: Secondary | ICD-10-CM

## 2012-08-31 DIAGNOSIS — Z9889 Other specified postprocedural states: Secondary | ICD-10-CM

## 2012-08-31 DIAGNOSIS — O3443 Maternal care for other abnormalities of cervix, third trimester: Secondary | ICD-10-CM

## 2012-08-31 DIAGNOSIS — Z1389 Encounter for screening for other disorder: Secondary | ICD-10-CM

## 2012-08-31 DIAGNOSIS — Z331 Pregnant state, incidental: Secondary | ICD-10-CM

## 2012-08-31 LAB — POCT URINALYSIS DIPSTICK
Blood, UA: NEGATIVE
Glucose, UA: NEGATIVE
Ketones, UA: NEGATIVE
Leukocytes, UA: NEGATIVE
Nitrite, UA: NEGATIVE
Protein, UA: NEGATIVE

## 2012-08-31 MED ORDER — HYDROXYPROGESTERONE CAPROATE 250 MG/ML IM OIL
250.0000 mg | TOPICAL_OIL | Freq: Once | INTRAMUSCULAR | Status: AC
  Administered 2012-08-31: 250 mg via INTRAMUSCULAR

## 2012-08-31 NOTE — Progress Notes (Signed)
Patient ID: Madison Golden, female   DOB: 12/21/1980, 32 y.o.   MRN: [id number] Pt here today for 17 P injection.

## 2012-09-01 ENCOUNTER — Encounter: Payer: Self-pay | Admitting: Advanced Practice Midwife

## 2012-09-01 ENCOUNTER — Ambulatory Visit (INDEPENDENT_AMBULATORY_CARE_PROVIDER_SITE_OTHER): Payer: 59 | Admitting: Advanced Practice Midwife

## 2012-09-01 VITALS — BP 102/70 | Wt 152.0 lb

## 2012-09-01 DIAGNOSIS — O0992 Supervision of high risk pregnancy, unspecified, second trimester: Secondary | ICD-10-CM

## 2012-09-01 DIAGNOSIS — O3440 Maternal care for other abnormalities of cervix, unspecified trimester: Secondary | ICD-10-CM

## 2012-09-01 DIAGNOSIS — Z1389 Encounter for screening for other disorder: Secondary | ICD-10-CM

## 2012-09-01 DIAGNOSIS — O09219 Supervision of pregnancy with history of pre-term labor, unspecified trimester: Secondary | ICD-10-CM

## 2012-09-01 DIAGNOSIS — O34219 Maternal care for unspecified type scar from previous cesarean delivery: Secondary | ICD-10-CM

## 2012-09-01 DIAGNOSIS — Z331 Pregnant state, incidental: Secondary | ICD-10-CM

## 2012-09-01 LAB — POCT URINALYSIS DIPSTICK
Blood, UA: NEGATIVE
Glucose, UA: NEGATIVE
Ketones, UA: NEGATIVE
Leukocytes, UA: NEGATIVE
Nitrite, UA: NEGATIVE
Protein, UA: NEGATIVE

## 2012-09-01 NOTE — Progress Notes (Signed)
No c/o at this time.  Routine questions about pregnancy answered.  F/U in 1 weeks for 17p and 2 weeks for LROB.

## 2012-09-01 NOTE — Progress Notes (Signed)
No complaints at this time.

## 2012-09-03 ENCOUNTER — Telehealth: Payer: Self-pay | Admitting: Obstetrics and Gynecology

## 2012-09-03 NOTE — Telephone Encounter (Signed)
Pt states having vaginal itching and irritation. Notice small amt of blood on tissue when she wiped. Pt states feels it is from were she has been irritated and scratching due to the itching. Pt states + FM. Appt made tomorrow to be evaluated. Pt informed to go to [facility] for any vaginal bleeding, decrease FM, gush of fluids. Pt verbalized understanding.

## 2012-09-04 ENCOUNTER — Ambulatory Visit (INDEPENDENT_AMBULATORY_CARE_PROVIDER_SITE_OTHER): Payer: 59 | Admitting: Obstetrics and Gynecology

## 2012-09-04 VITALS — BP 110/70 | Wt 152.4 lb

## 2012-09-04 DIAGNOSIS — O2390 Unspecified genitourinary tract infection in pregnancy, unspecified trimester: Secondary | ICD-10-CM

## 2012-09-04 DIAGNOSIS — O0993 Supervision of high risk pregnancy, unspecified, third trimester: Secondary | ICD-10-CM

## 2012-09-04 DIAGNOSIS — O09899 Supervision of other high risk pregnancies, unspecified trimester: Secondary | ICD-10-CM

## 2012-09-04 DIAGNOSIS — Z331 Pregnant state, incidental: Secondary | ICD-10-CM

## 2012-09-04 DIAGNOSIS — N898 Other specified noninflammatory disorders of vagina: Secondary | ICD-10-CM

## 2012-09-04 DIAGNOSIS — Z309 Encounter for contraceptive management, unspecified: Secondary | ICD-10-CM

## 2012-09-04 DIAGNOSIS — Z1389 Encounter for screening for other disorder: Secondary | ICD-10-CM

## 2012-09-04 DIAGNOSIS — O34219 Maternal care for unspecified type scar from previous cesarean delivery: Secondary | ICD-10-CM

## 2012-09-04 DIAGNOSIS — O09219 Supervision of pregnancy with history of pre-term labor, unspecified trimester: Secondary | ICD-10-CM

## 2012-09-04 DIAGNOSIS — O3440 Maternal care for other abnormalities of cervix, unspecified trimester: Secondary | ICD-10-CM

## 2012-09-04 LAB — POCT URINALYSIS DIPSTICK
Blood, UA: NEGATIVE
Glucose, UA: NEGATIVE
Ketones, UA: NEGATIVE
Nitrite, UA: NEGATIVE
Protein, UA: NEGATIVE

## 2012-09-04 LAB — POCT WET PREP WITH KOH
Bacteria Wet Prep HPF POC: NORMAL
KOH Prep POC: NEGATIVE
Trichomonas, UA: NEGATIVE
Yeast Wet Prep HPF POC: NEGATIVE

## 2012-09-04 MED ORDER — NYSTATIN-TRIAMCINOLONE 100000-0.1 UNIT/GM-% EX OINT: TOPICAL_OINTMENT | Freq: Two times a day (BID) | CUTANEOUS | Status: DC

## 2012-09-04 NOTE — Progress Notes (Signed)
Vulvar irritation : bilateral labia majora at skin crease edges externally, + itchy. No recent abx KOH -, Wet prep WBC only, rare clue cell, good lactobacillus, IMP chronic moisture changes Plan Rx MYtrex topical

## 2012-09-04 NOTE — Progress Notes (Signed)
Pt c/o vaginal irritation  

## 2012-09-04 NOTE — Patient Instructions (Signed)
Use twice daily

## 2012-09-08 ENCOUNTER — Encounter: Payer: Self-pay | Admitting: Women's Health

## 2012-09-08 ENCOUNTER — Encounter: Payer: Self-pay | Admitting: Adult Health

## 2012-09-08 ENCOUNTER — Ambulatory Visit (INDEPENDENT_AMBULATORY_CARE_PROVIDER_SITE_OTHER): Payer: 59 | Admitting: Adult Health

## 2012-09-08 VITALS — BP 100/62 | Ht 63.0 in | Wt 154.0 lb

## 2012-09-08 DIAGNOSIS — Z1389 Encounter for screening for other disorder: Secondary | ICD-10-CM

## 2012-09-08 DIAGNOSIS — O09219 Supervision of pregnancy with history of pre-term labor, unspecified trimester: Secondary | ICD-10-CM

## 2012-09-08 DIAGNOSIS — Z331 Pregnant state, incidental: Secondary | ICD-10-CM

## 2012-09-08 DIAGNOSIS — O09893 Supervision of other high risk pregnancies, third trimester: Secondary | ICD-10-CM

## 2012-09-08 LAB — POCT URINALYSIS DIPSTICK
Blood, UA: NEGATIVE
Glucose, UA: NEGATIVE
Ketones, UA: NEGATIVE
Leukocytes, UA: NEGATIVE
Nitrite, UA: NEGATIVE
Protein, UA: NEGATIVE

## 2012-09-08 MED ORDER — HYDROXYPROGESTERONE CAPROATE 250 MG/ML IM OIL
250.0000 mg | TOPICAL_OIL | Freq: Once | INTRAMUSCULAR | Status: AC
  Administered 2012-09-08: 250 mg via INTRAMUSCULAR

## 2012-09-08 NOTE — Progress Notes (Signed)
Patient ID: Madison Golden, female   DOB: [birth year], 32 y.o.   MRN: [id number] Pt states she has good fetal movement. Pt denies any swelling, bleeding, problems or concerns at this time. Pt here today for 17P injection

## 2012-09-15 ENCOUNTER — Ambulatory Visit (INDEPENDENT_AMBULATORY_CARE_PROVIDER_SITE_OTHER): Payer: 59 | Admitting: Obstetrics & Gynecology

## 2012-09-15 VITALS — BP 100/60 | Wt 153.0 lb

## 2012-09-15 DIAGNOSIS — O34219 Maternal care for unspecified type scar from previous cesarean delivery: Secondary | ICD-10-CM

## 2012-09-15 DIAGNOSIS — Z331 Pregnant state, incidental: Secondary | ICD-10-CM

## 2012-09-15 DIAGNOSIS — Z1389 Encounter for screening for other disorder: Secondary | ICD-10-CM

## 2012-09-15 DIAGNOSIS — O09893 Supervision of other high risk pregnancies, third trimester: Secondary | ICD-10-CM

## 2012-09-15 DIAGNOSIS — Z8751 Personal history of pre-term labor: Secondary | ICD-10-CM

## 2012-09-15 DIAGNOSIS — O3440 Maternal care for other abnormalities of cervix, unspecified trimester: Secondary | ICD-10-CM

## 2012-09-15 DIAGNOSIS — O09219 Supervision of pregnancy with history of pre-term labor, unspecified trimester: Secondary | ICD-10-CM

## 2012-09-15 LAB — POCT URINALYSIS DIPSTICK
Blood, UA: NEGATIVE
Glucose, UA: NEGATIVE
Ketones, UA: NEGATIVE
Nitrite, UA: NEGATIVE

## 2012-09-15 MED ORDER — HYDROXYPROGESTERONE CAPROATE 250 MG/ML IM OIL
250.0000 mg | TOPICAL_OIL | Freq: Once | INTRAMUSCULAR | Status: AC
  Administered 2012-09-15: 250 mg via INTRAMUSCULAR

## 2012-09-15 NOTE — Progress Notes (Signed)
No complaints at this time.17 P today.

## 2012-09-15 NOTE — Progress Notes (Signed)
BP weight and urine results all reviewed and noted. Patient reports good fetal movement, denies any bleeding and no rupture of membranes symptoms or regular contractions. Patient is without complaints. All questions were answered.  

## 2012-09-16 ENCOUNTER — Encounter: Payer: 59 | Admitting: Advanced Practice Midwife

## 2012-09-21 ENCOUNTER — Ambulatory Visit (INDEPENDENT_AMBULATORY_CARE_PROVIDER_SITE_OTHER): Payer: 59 | Admitting: Adult Health

## 2012-09-21 ENCOUNTER — Encounter: Payer: Self-pay | Admitting: Adult Health

## 2012-09-21 VITALS — BP 122/84 | Wt 153.8 lb

## 2012-09-21 DIAGNOSIS — O34219 Maternal care for unspecified type scar from previous cesarean delivery: Secondary | ICD-10-CM

## 2012-09-21 DIAGNOSIS — Z8751 Personal history of pre-term labor: Secondary | ICD-10-CM

## 2012-09-21 DIAGNOSIS — O09893 Supervision of other high risk pregnancies, third trimester: Secondary | ICD-10-CM

## 2012-09-21 DIAGNOSIS — Z1389 Encounter for screening for other disorder: Secondary | ICD-10-CM

## 2012-09-21 DIAGNOSIS — O3440 Maternal care for other abnormalities of cervix, unspecified trimester: Secondary | ICD-10-CM

## 2012-09-21 DIAGNOSIS — Z309 Encounter for contraceptive management, unspecified: Secondary | ICD-10-CM

## 2012-09-21 DIAGNOSIS — Z331 Pregnant state, incidental: Secondary | ICD-10-CM

## 2012-09-21 DIAGNOSIS — O09219 Supervision of pregnancy with history of pre-term labor, unspecified trimester: Secondary | ICD-10-CM

## 2012-09-21 LAB — POCT URINALYSIS DIPSTICK
Blood, UA: NEGATIVE
Glucose, UA: NEGATIVE
Ketones, UA: NEGATIVE
Nitrite, UA: NEGATIVE
Protein, UA: NEGATIVE

## 2012-09-21 MED ORDER — HYDROXYPROGESTERONE CAPROATE 250 MG/ML IM OIL
250.0000 mg | TOPICAL_OIL | Freq: Once | INTRAMUSCULAR | Status: AC
  Administered 2012-09-21: 250 mg via INTRAMUSCULAR

## 2012-09-28 ENCOUNTER — Ambulatory Visit (INDEPENDENT_AMBULATORY_CARE_PROVIDER_SITE_OTHER): Payer: 59 | Admitting: Women's Health

## 2012-09-28 ENCOUNTER — Encounter: Payer: Self-pay | Admitting: Women's Health

## 2012-09-28 VITALS — BP 90/68 | Wt 156.0 lb

## 2012-09-28 DIAGNOSIS — O09893 Supervision of other high risk pregnancies, third trimester: Secondary | ICD-10-CM

## 2012-09-28 DIAGNOSIS — O0993 Supervision of high risk pregnancy, unspecified, third trimester: Secondary | ICD-10-CM

## 2012-09-28 DIAGNOSIS — O34219 Maternal care for unspecified type scar from previous cesarean delivery: Secondary | ICD-10-CM

## 2012-09-28 DIAGNOSIS — Z1389 Encounter for screening for other disorder: Secondary | ICD-10-CM

## 2012-09-28 DIAGNOSIS — Z331 Pregnant state, incidental: Secondary | ICD-10-CM

## 2012-09-28 DIAGNOSIS — O09219 Supervision of pregnancy with history of pre-term labor, unspecified trimester: Secondary | ICD-10-CM

## 2012-09-28 LAB — POCT URINALYSIS DIPSTICK
Blood, UA: NEGATIVE
Glucose, UA: NEGATIVE
Ketones, UA: NEGATIVE
Leukocytes, UA: NEGATIVE
Nitrite, UA: NEGATIVE
Protein, UA: NEGATIVE

## 2012-09-28 MED ORDER — HYDROXYPROGESTERONE CAPROATE 250 MG/ML IM OIL
250.0000 mg | TOPICAL_OIL | Freq: Once | INTRAMUSCULAR | Status: AC
  Administered 2012-09-28: 250 mg via INTRAMUSCULAR

## 2012-09-28 NOTE — Patient Instructions (Addendum)

## 2012-09-28 NOTE — Progress Notes (Signed)
Pt here today for routine visit and 17P injection. Pt states she has noticed some swelling in her hands and feet on the days she works. Pt states that she has noticed some dull achy pains in her lower abdominal area, notices it more when working or on feet all day. Pt denies any other problems or concerns at this time.

## 2012-09-28 NOTE — Progress Notes (Signed)
17P today. Reports good fm. Denies uc's, lof, vb, urinary frequency, urgency, hesitancy, or dysuria.  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 SVE: LTC, high.  Recommended taking frequent breaks, increase po hydration, decrease hours if necessary w/ her h/o ptb. Reviewed ptl s/s, fkc.  All questions answered. F/U in 2wks for visit.

## 2012-10-05 ENCOUNTER — Ambulatory Visit (INDEPENDENT_AMBULATORY_CARE_PROVIDER_SITE_OTHER): Payer: 59 | Admitting: Adult Health

## 2012-10-05 ENCOUNTER — Encounter: Payer: Self-pay | Admitting: Adult Health

## 2012-10-05 VITALS — BP 100/62 | Ht 63.0 in | Wt 157.0 lb

## 2012-10-05 DIAGNOSIS — O09219 Supervision of pregnancy with history of pre-term labor, unspecified trimester: Secondary | ICD-10-CM

## 2012-10-05 DIAGNOSIS — O09893 Supervision of other high risk pregnancies, third trimester: Secondary | ICD-10-CM

## 2012-10-05 DIAGNOSIS — Z9889 Other specified postprocedural states: Secondary | ICD-10-CM

## 2012-10-05 DIAGNOSIS — Z309 Encounter for contraceptive management, unspecified: Secondary | ICD-10-CM

## 2012-10-05 DIAGNOSIS — Z01419 Encounter for gynecological examination (general) (routine) without abnormal findings: Secondary | ICD-10-CM

## 2012-10-05 DIAGNOSIS — Z1389 Encounter for screening for other disorder: Secondary | ICD-10-CM

## 2012-10-05 DIAGNOSIS — O34219 Maternal care for unspecified type scar from previous cesarean delivery: Secondary | ICD-10-CM

## 2012-10-05 DIAGNOSIS — O3443 Maternal care for other abnormalities of cervix, third trimester: Secondary | ICD-10-CM

## 2012-10-05 DIAGNOSIS — Z331 Pregnant state, incidental: Secondary | ICD-10-CM

## 2012-10-05 LAB — POCT URINALYSIS DIPSTICK
Blood, UA: NEGATIVE
Glucose, UA: NEGATIVE
Ketones, UA: NEGATIVE
Leukocytes, UA: NEGATIVE
Nitrite, UA: NEGATIVE
Protein, UA: NEGATIVE

## 2012-10-05 MED ORDER — HYDROXYPROGESTERONE CAPROATE 250 MG/ML IM OIL
250.0000 mg | TOPICAL_OIL | Freq: Once | INTRAMUSCULAR | Status: AC
  Administered 2012-10-05: 250 mg via INTRAMUSCULAR

## 2012-10-05 NOTE — Progress Notes (Signed)
Patient ID: Doreene Nest, female   DOB: [birth year], 32 y.o.   MRN: [id number] Pt here today for weekly 17P injection. Pt states she has good fetal movement. Pt denies any bleeding, swelling, no gush of fluid, and no contractions. Pt has a negative urine.

## 2012-10-12 ENCOUNTER — Encounter: Payer: Self-pay | Admitting: Women's Health

## 2012-10-12 ENCOUNTER — Ambulatory Visit (INDEPENDENT_AMBULATORY_CARE_PROVIDER_SITE_OTHER): Payer: 59 | Admitting: Women's Health

## 2012-10-12 VITALS — BP 102/78 | Wt 158.5 lb

## 2012-10-12 DIAGNOSIS — O0993 Supervision of high risk pregnancy, unspecified, third trimester: Secondary | ICD-10-CM

## 2012-10-12 DIAGNOSIS — O34219 Maternal care for unspecified type scar from previous cesarean delivery: Secondary | ICD-10-CM

## 2012-10-12 DIAGNOSIS — O09219 Supervision of pregnancy with history of pre-term labor, unspecified trimester: Secondary | ICD-10-CM

## 2012-10-12 LAB — POCT URINALYSIS DIPSTICK
Blood, UA: NEGATIVE
Glucose, UA: NEGATIVE
Ketones, UA: NEGATIVE
Nitrite, UA: NEGATIVE
Protein, UA: NEGATIVE

## 2012-10-12 MED ORDER — HYDROXYPROGESTERONE CAPROATE 250 MG/ML IM OIL
250.0000 mg | TOPICAL_OIL | Freq: Once | INTRAMUSCULAR | Status: AC
  Administered 2012-10-12: 250 mg via INTRAMUSCULAR

## 2012-10-12 NOTE — Patient Instructions (Signed)
Breastfeeding A change in hormones during your pregnancy causes growth of your breast tissue and an increase in number and size of milk ducts. The hormone prolactin allows proteins, sugars, and fats from your blood supply to make breast milk in your milk-producing glands. The hormone progesterone prevents breast milk from being released before the birth of your baby. After the birth of your baby, your progesterone level decreases allowing breast milk to be released. Thoughts of your baby, as well as his or her sucking or crying, can stimulate the release of milk from the milk-producing glands. Deciding to breastfeed (nurse) is one of the best choices you can make for you and your baby. The information that follows gives a brief review of the benefits, as well as other important skills to know about breastfeeding. BENEFITS OF BREASTFEEDING For your baby  The first milk (colostrum) helps your baby's digestive system function better.   There are antibodies in your milk that help your baby fight off infections.   Your baby has a lower incidence of asthma, allergies, and sudden infant death syndrome (SIDS).   The nutrients in breast milk are better for your baby than infant formulas.  Breast milk improves your baby's brain development.   Your baby will have less gas, colic, and constipation.  Your baby is less likely to develop other conditions, such as childhood obesity, asthma, or diabetes mellitus. For you  Breastfeeding helps develop a very special bond between you and your baby.   Breastfeeding is convenient, always available at the correct temperature, and costs nothing.   Breastfeeding helps to burn calories and helps you lose the weight gained during pregnancy.   Breastfeeding makes your uterus contract back down to normal size faster and slows bleeding following delivery.   Breastfeeding mothers have a lower risk of developing osteoporosis or breast or ovarian cancer later  in life.  BREASTFEEDING FREQUENCY  A healthy, full-term baby may breastfeed as often as every hour or space his or her feedings to every 3 hours. Breastfeeding frequency will vary from baby to baby.   Newborns should be fed no less than every 2 3 hours during the day and every 4 5 hours during the night. You should breastfeed a minimum of 8 feedings in a 24 hour period.  Awaken your baby to breastfeed if it has been 3 4 hours since the last feeding.  Breastfeed when you feel the need to reduce the fullness of your breasts or when your newborn shows signs of hunger. Signs that your baby may be hungry include:  Increased alertness or activity.  Stretching.  Movement of the head from side to side.  Movement of the head and opening of the mouth when the corner of the mouth or cheek is stroked (rooting).  Increased sucking sounds, smacking lips, cooing, sighing, or squeaking.  Hand-to-mouth movements.  Increased sucking of fingers or hands.  Fussing.  Intermittent crying.  Signs of extreme hunger will require calming and consoling before you try to feed your baby. Signs of extreme hunger may include:  Restlessness.  A loud, strong cry.  Screaming.  Frequent feeding will help you make more milk and will help prevent problems, such as sore nipples and engorgement of the breasts.  BREASTFEEDING   Whether lying down or sitting, be sure that the baby's abdomen is facing your abdomen.   Support your breast with 4 fingers under your breast and your thumb above your nipple. Make sure your fingers are well away from   your nipple and your baby's mouth.   Stroke your baby's lips gently with your finger or nipple.   When your baby's mouth is open wide enough, place all of your nipple and as much of the colored area around your nipple (areola) as possible into your baby's mouth.  More areola should be visible above his or her upper lip than below his or her lower lip.  Your  baby's tongue should be between his or her lower gum and your breast.  Ensure that your baby's mouth is correctly positioned around the nipple (latched). Your baby's lips should create a seal on your breast.  Signs that your baby has effectively latched onto your nipple include:  Tugging or sucking without pain.  Swallowing heard between sucks.  Absent click or smacking sound.  Muscle movement above and in front of his or her ears with sucking.  Your baby must suck about 2 3 minutes in order to get your milk. Allow your baby to feed on each breast as long as he or she wants. Nurse your baby until he or she unlatches or falls asleep at the first breast, then offer the second breast.  Signs that your baby is full and satisfied include:  A gradual decrease in the number of sucks or complete cessation of sucking.  Falling asleep.  Extension or relaxation of his or her body.  Retention of a small amount of milk in his or her mouth.  Letting go of your breast by himself or herself.  Signs of effective breastfeeding in you include:  Breasts that have increased firmness, weight, and size prior to feeding.  Breasts that are softer after nursing.  Increased milk volume, as well as a change in milk consistency and color by the 5th day of breastfeeding.  Breast fullness relieved by breastfeeding.  Nipples are not sore, cracked, or bleeding.  If needed, break the suction by putting your finger into the corner of your baby's mouth and sliding your finger between his or her gums. Then, remove your breast from his or her mouth.  It is common for babies to spit up a small amount after a feeding.  Babies often swallow air during feeding. This can make babies fussy. Burping your baby between breasts can help with this.  Vitamin D supplements are recommended for babies who get only breast milk.  Avoid using a pacifier during your baby's first 4 6 weeks.  Avoid supplemental feedings of  water, formula, or juice in place of breastfeeding. Breast milk is all the food your baby needs. It is not necessary for your baby to have water or formula. Your breasts will make more milk if supplemental feedings are avoided during the early weeks. HOW TO TELL WHETHER YOUR BABY IS GETTING ENOUGH BREAST MILK Wondering whether or not your baby is getting enough milk is a common concern among mothers. You can be assured that your baby is getting enough milk if:   Your baby is actively sucking and you hear swallowing.   Your baby seems relaxed and satisfied after a feeding.   Your baby nurses at least 8 12 times in a 24 hour time period.  During the first 3 5 days of age:  Your baby is wetting at least 3 5 diapers in a 24 hour period. The urine should be clear and pale yellow.  Your baby is having at least 3 4 stools in a 24 hour period. The stool should be soft and yellow.  At   5 7 days of age, your baby is having at least 3 6 stools in a 24 hour period. The stool should be seedy and yellow by 5 days of age.  Your baby has a weight loss less than 7 10% during the first 3 days of age.  Your baby does not lose weight after 3 7 days of age.  Your baby gains 4 7 ounces each week after he or she is 4 days of age.  Your baby gains weight by 5 days of age and is back to birth weight within 2 weeks. ENGORGEMENT In the first week after your baby is born, you may experience extremely full breasts (engorgement). When engorged, your breasts may feel heavy, warm, or tender to the touch. Engorgement peaks within 24 48 hours after delivery of your baby.  Engorgement may be reduced by:  Continuing to breastfeed.  Increasing the frequency of breastfeeding.  Taking warm showers or applying warm, moist heat to your breasts just before each feeding. This increases circulation and helps the milk flow.   Gently massaging your breast before and during the feedings. With your fingertips, massage from  your chest wall towards your nipple in a circular motion.   Ensuring that your baby empties at least one breast at every feeding. It also helps to start the next feeding on the opposite breast.   Expressing breast milk by hand or by using a breast pump to empty the breasts if your baby is sleepy, or not nursing well. You may also want to express milk if you are returning to work oryou feel you are getting engorged.  Ensuring your baby is latched on and positioned properly while breastfeeding. If you follow these suggestions, your engorgement should improve in 24 48 hours. If you are still experiencing difficulty, call your lactation consultant or caregiver.  CARING FOR YOURSELF Take care of your breasts.  Bathe or shower daily.   Avoid using soap on your nipples.   Wear a supportive bra. Avoid wearing underwire style bras.  Air dry your nipples for a 3 4minutes after each feeding.   Use only cotton bra pads to absorb breast milk leakage. Leaking of breast milk between feedings is normal.   Use only pure lanolin on your nipples after nursing. You do not need to wash it off before feeding your baby again. Another option is to express a few drops of breast milk and gently massage that milk into your nipples.  Continue breast self-awareness checks. Take care of yourself.  Eat healthy foods. Alternate 3 meals with 3 snacks.  Avoid foods that you notice affect your baby in a bad way.  Drink milk, fruit juice, and water to satisfy your thirst (about 8 glasses a day).   Rest often, relax, and take your prenatal vitamins to prevent fatigue, stress, and anemia.  Avoid chewing and smoking tobacco.  Avoid alcohol and drug use.  Take over-the-counter and prescribed medicine only as directed by your caregiver or pharmacist. You should always check with your caregiver or pharmacist before taking any new medicine, vitamin, or herbal supplement.  Know that pregnancy is possible while  breastfeeding. If desired, talk to your caregiver about family planning and safe birth control methods that may be used while breastfeeding. SEEK MEDICAL CARE IF:   You feel like you want to stop breastfeeding or have become frustrated with breastfeeding.  You have painful breasts or nipples.  Your nipples are cracked or bleeding.  Your breasts are red, tender,   or warm.  You have a swollen area on either breast.  You have a fever or chills.  You have nausea or vomiting.  You have drainage from your nipples.  Your breasts do not become full before feedings by the 5th day after delivery.  You feel sad and depressed.  Your baby is too sleepy to eat well.  Your baby is having trouble sleeping.   Your baby is wetting less than 3 diapers in a 24 hour period.  Your baby has less than 3 stools in a 24 hour period.  Your baby's skin or the white part of his or her eyes becomes more yellow.   Your baby is not gaining weight by 5 days of age. MAKE SURE YOU:   Understand these instructions.  Will watch your condition.  Will get help right away if you are not doing well or get worse. Document Released: 12/24/2004 Document Revised: 09/18/2011 Document Reviewed: 07/31/2011 [employer] Patient Information 2014 [employer].  

## 2012-10-12 NOTE — Progress Notes (Signed)
Reports good fm. Denies regular uc's, lof, vb, urinary frequency, urgency, hesitancy, or dysuria.  No complaints.  Reviewed ptl s/s, fkc.  All questions answered. F/U in 1wk for visit, gbs.

## 2012-10-19 ENCOUNTER — Encounter: Payer: Self-pay | Admitting: Women's Health

## 2012-10-19 ENCOUNTER — Ambulatory Visit (INDEPENDENT_AMBULATORY_CARE_PROVIDER_SITE_OTHER): Payer: 59 | Admitting: Women's Health

## 2012-10-19 VITALS — BP 110/60 | Wt 160.0 lb

## 2012-10-19 DIAGNOSIS — O0993 Supervision of high risk pregnancy, unspecified, third trimester: Secondary | ICD-10-CM

## 2012-10-19 DIAGNOSIS — Z1389 Encounter for screening for other disorder: Secondary | ICD-10-CM

## 2012-10-19 DIAGNOSIS — Z331 Pregnant state, incidental: Secondary | ICD-10-CM

## 2012-10-19 DIAGNOSIS — O34219 Maternal care for unspecified type scar from previous cesarean delivery: Secondary | ICD-10-CM

## 2012-10-19 DIAGNOSIS — O09219 Supervision of pregnancy with history of pre-term labor, unspecified trimester: Secondary | ICD-10-CM

## 2012-10-19 LAB — POCT URINALYSIS DIPSTICK
Blood, UA: NEGATIVE
Glucose, UA: NEGATIVE
Ketones, UA: NEGATIVE
Nitrite, UA: NEGATIVE
Protein, UA: NEGATIVE

## 2012-10-19 LAB — OB RESULTS CONSOLE GC/CHLAMYDIA
Chlamydia: NEGATIVE
Gonorrhea: NEGATIVE

## 2012-10-19 MED ORDER — HYDROXYPROGESTERONE CAPROATE 250 MG/ML IM OIL
250.0000 mg | TOPICAL_OIL | Freq: Once | INTRAMUSCULAR | Status: AC
  Administered 2012-10-19: 250 mg via INTRAMUSCULAR

## 2012-10-19 NOTE — Patient Instructions (Signed)

## 2012-10-19 NOTE — Progress Notes (Signed)
Reports good fm. Denies regular uc's, lof, vb, urinary frequency, urgency, hesitancy, or dysuria.  Some pressure. Last 17P shot today! GBS today, SVE: outer os 1cm, unable to reach any further, vtx.  Reviewed ptl s/s, fkc.  All questions answered. F/U in 1wk for visit.

## 2012-10-20 LAB — GC/CHLAMYDIA PROBE AMP
CT Probe RNA: NEGATIVE
GC Probe RNA: NEGATIVE

## 2012-10-21 LAB — STREP B DNA PROBE: GBSP: NEGATIVE

## 2012-10-22 ENCOUNTER — Encounter: Payer: Self-pay | Admitting: Women's Health

## 2012-10-26 ENCOUNTER — Ambulatory Visit (INDEPENDENT_AMBULATORY_CARE_PROVIDER_SITE_OTHER): Payer: Self-pay | Admitting: Women's Health

## 2012-10-26 ENCOUNTER — Encounter: Payer: Self-pay | Admitting: Women's Health

## 2012-10-26 VITALS — BP 108/80 | Wt 159.0 lb

## 2012-10-26 DIAGNOSIS — O09219 Supervision of pregnancy with history of pre-term labor, unspecified trimester: Secondary | ICD-10-CM

## 2012-10-26 DIAGNOSIS — O0993 Supervision of high risk pregnancy, unspecified, third trimester: Secondary | ICD-10-CM

## 2012-10-26 DIAGNOSIS — Z331 Pregnant state, incidental: Secondary | ICD-10-CM

## 2012-10-26 DIAGNOSIS — O99019 Anemia complicating pregnancy, unspecified trimester: Secondary | ICD-10-CM

## 2012-10-26 DIAGNOSIS — O3440 Maternal care for other abnormalities of cervix, unspecified trimester: Secondary | ICD-10-CM

## 2012-10-26 DIAGNOSIS — Z1389 Encounter for screening for other disorder: Secondary | ICD-10-CM

## 2012-10-26 DIAGNOSIS — O34219 Maternal care for unspecified type scar from previous cesarean delivery: Secondary | ICD-10-CM

## 2012-10-26 LAB — POCT URINALYSIS DIPSTICK
Glucose, UA: NEGATIVE
Ketones, UA: NEGATIVE
Nitrite, UA: NEGATIVE
Protein, UA: NEGATIVE

## 2012-10-26 NOTE — Progress Notes (Signed)
Reports good fm. Denies consistently regular uc's, lof, vb, urinary frequency, urgency, hesitancy, or dysuria.  Feels like baby has dropped, FH correlates w/ this.  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 Reviewed labor s/s, fkc.  All questions answered. F/U in 1wk for visit.

## 2012-10-26 NOTE — Patient Instructions (Signed)

## 2012-11-02 ENCOUNTER — Encounter: Payer: Self-pay | Admitting: Women's Health

## 2012-11-02 ENCOUNTER — Ambulatory Visit (INDEPENDENT_AMBULATORY_CARE_PROVIDER_SITE_OTHER): Payer: Self-pay | Admitting: Women's Health

## 2012-11-02 VITALS — BP 130/74 | Wt 162.0 lb

## 2012-11-02 DIAGNOSIS — O34219 Maternal care for unspecified type scar from previous cesarean delivery: Secondary | ICD-10-CM

## 2012-11-02 DIAGNOSIS — O09219 Supervision of pregnancy with history of pre-term labor, unspecified trimester: Secondary | ICD-10-CM

## 2012-11-02 DIAGNOSIS — O99019 Anemia complicating pregnancy, unspecified trimester: Secondary | ICD-10-CM

## 2012-11-02 DIAGNOSIS — O3440 Maternal care for other abnormalities of cervix, unspecified trimester: Secondary | ICD-10-CM

## 2012-11-02 DIAGNOSIS — O0993 Supervision of high risk pregnancy, unspecified, third trimester: Secondary | ICD-10-CM

## 2012-11-02 DIAGNOSIS — O09893 Supervision of other high risk pregnancies, third trimester: Secondary | ICD-10-CM

## 2012-11-02 DIAGNOSIS — Z331 Pregnant state, incidental: Secondary | ICD-10-CM

## 2012-11-02 DIAGNOSIS — Z1389 Encounter for screening for other disorder: Secondary | ICD-10-CM

## 2012-11-02 LAB — POCT URINALYSIS DIPSTICK
Blood, UA: NEGATIVE
Glucose, UA: NEGATIVE
Ketones, UA: NEGATIVE
Leukocytes, UA: NEGATIVE
Nitrite, UA: NEGATIVE
Protein, UA: NEGATIVE

## 2012-11-02 NOTE — Patient Instructions (Signed)

## 2012-11-02 NOTE — Progress Notes (Signed)
Reports good fm. Denies uc's, lof, vb, urinary frequency, urgency, hesitancy, or dysuria.  Increased pressure.  SVE: outer os 1cm, unable to reach any further, very posterior, vtx. Reviewed labor s/s, fkc.  All questions answered. F/U in 1wk for visit.

## 2012-11-09 ENCOUNTER — Inpatient Hospital Stay (HOSPITAL_COMMUNITY)
Admission: AD | Admit: 2012-11-09 | Discharge: 2012-11-11 | DRG: 767 | Disposition: A | Discharge status: Home / Self Care | Payer: 59 | Source: Ambulatory Visit | Attending: Obstetrics & Gynecology | Admitting: Obstetrics & Gynecology

## 2012-11-09 ENCOUNTER — Inpatient Hospital Stay (HOSPITAL_COMMUNITY)
Admission: AD | Admit: 2012-11-09 | Discharge: 2012-11-09 | Disposition: A | Discharge status: Home / Self Care | Payer: 59 | Source: Ambulatory Visit | Attending: Obstetrics & Gynecology | Admitting: Obstetrics & Gynecology

## 2012-11-09 ENCOUNTER — Encounter (HOSPITAL_COMMUNITY): Payer: Self-pay

## 2012-11-09 ENCOUNTER — Encounter (HOSPITAL_COMMUNITY): Payer: Self-pay | Admitting: *Deleted

## 2012-11-09 ENCOUNTER — Ambulatory Visit (INDEPENDENT_AMBULATORY_CARE_PROVIDER_SITE_OTHER): Payer: Self-pay | Admitting: Obstetrics & Gynecology

## 2012-11-09 ENCOUNTER — Encounter: Payer: Self-pay | Admitting: Obstetrics & Gynecology

## 2012-11-09 VITALS — BP 114/80 | Wt 162.0 lb

## 2012-11-09 DIAGNOSIS — O09219 Supervision of pregnancy with history of pre-term labor, unspecified trimester: Secondary | ICD-10-CM

## 2012-11-09 DIAGNOSIS — O0940 Supervision of pregnancy with grand multiparity, unspecified trimester: Secondary | ICD-10-CM

## 2012-11-09 DIAGNOSIS — O3440 Maternal care for other abnormalities of cervix, unspecified trimester: Secondary | ICD-10-CM

## 2012-11-09 DIAGNOSIS — O09893 Supervision of other high risk pregnancies, third trimester: Secondary | ICD-10-CM

## 2012-11-09 DIAGNOSIS — Z309 Encounter for contraceptive management, unspecified: Secondary | ICD-10-CM

## 2012-11-09 DIAGNOSIS — O3443 Maternal care for other abnormalities of cervix, third trimester: Secondary | ICD-10-CM

## 2012-11-09 DIAGNOSIS — Z1389 Encounter for screening for other disorder: Secondary | ICD-10-CM

## 2012-11-09 DIAGNOSIS — O34219 Maternal care for unspecified type scar from previous cesarean delivery: Secondary | ICD-10-CM

## 2012-11-09 DIAGNOSIS — Z9889 Other specified postprocedural states: Secondary | ICD-10-CM

## 2012-11-09 DIAGNOSIS — O479 False labor, unspecified: Secondary | ICD-10-CM | POA: Insufficient documentation

## 2012-11-09 DIAGNOSIS — Z3483 Encounter for supervision of other normal pregnancy, third trimester: Secondary | ICD-10-CM

## 2012-11-09 DIAGNOSIS — O210 Mild hyperemesis gravidarum: Secondary | ICD-10-CM

## 2012-11-09 DIAGNOSIS — O99019 Anemia complicating pregnancy, unspecified trimester: Secondary | ICD-10-CM

## 2012-11-09 DIAGNOSIS — Z302 Encounter for sterilization: Secondary | ICD-10-CM

## 2012-11-09 LAB — POCT URINALYSIS DIPSTICK
Blood, UA: NEGATIVE
Glucose, UA: NEGATIVE
Ketones, UA: NEGATIVE
Nitrite, UA: NEGATIVE
Protein, UA: NEGATIVE

## 2012-11-09 MED ORDER — LIDOCAINE HCL (PF) 1 % IJ SOLN
30.0000 mL | INTRAMUSCULAR | Status: DC | PRN
  Administered 2012-11-09: 30 mL via SUBCUTANEOUS
  Filled 2012-11-09 (×2): qty 30

## 2012-11-09 MED ORDER — IBUPROFEN 600 MG PO TABS
600.0000 mg | ORAL_TABLET | Freq: Four times a day (QID) | ORAL | Status: DC | PRN
  Administered 2012-11-10: 600 mg via ORAL
  Filled 2012-11-09: qty 1

## 2012-11-09 MED ORDER — OXYCODONE-ACETAMINOPHEN 5-325 MG PO TABS: 1.0000 | ORAL_TABLET | ORAL | Status: DC | PRN

## 2012-11-09 MED ORDER — OXYTOCIN 40 UNITS IN LACTATED RINGERS INFUSION - SIMPLE MED: 62.5000 mL/h | INTRAVENOUS | Status: DC

## 2012-11-09 MED ORDER — ONDANSETRON HCL 4 MG/2ML IJ SOLN: 4.0000 mg | Freq: Four times a day (QID) | INTRAMUSCULAR | Status: DC | PRN

## 2012-11-09 MED ORDER — FLEET ENEMA 7-19 GM/118ML RE ENEM: 1.0000 | ENEMA | RECTAL | Status: DC | PRN

## 2012-11-09 MED ORDER — OXYTOCIN 10 UNIT/ML IJ SOLN
10.0000 [IU] | Freq: Once | INTRAMUSCULAR | Status: AC
  Administered 2012-11-09: 10 [IU] via INTRAMUSCULAR
  Filled 2012-11-09: qty 1

## 2012-11-09 MED ORDER — ACETAMINOPHEN 325 MG PO TABS: 650.0000 mg | ORAL_TABLET | ORAL | Status: DC | PRN

## 2012-11-09 MED ORDER — LACTATED RINGERS IV SOLN: INTRAVENOUS | Status: DC

## 2012-11-09 MED ORDER — CITRIC ACID-SODIUM CITRATE 334-500 MG/5ML PO SOLN: 30.0000 mL | ORAL | Status: DC | PRN

## 2012-11-09 MED ORDER — MISOPROSTOL 200 MCG PO TABS
800.0000 ug | ORAL_TABLET | Freq: Once | ORAL | Status: DC
  Filled 2012-11-09: qty 4

## 2012-11-09 MED ORDER — OXYTOCIN BOLUS FROM INFUSION: 500.0000 mL | INTRAVENOUS | Status: DC

## 2012-11-09 MED ORDER — LACTATED RINGERS IV SOLN: 500.0000 mL | INTRAVENOUS | Status: DC | PRN

## 2012-11-09 NOTE — Addendum Note (Signed)
Addended by: Richardson Chiquito on: 11/09/2012 12:35 PM   Modules accepted: Orders

## 2012-11-09 NOTE — MAU Note (Signed)
Pt brought back from lobby to room 1 due to painful contractions, SVE 8, SROM at 2035, clear fluid. Call to Smith,CNM pt transported to room 176 per bed.

## 2012-11-09 NOTE — MAU Note (Signed)
Contractions started around 2, now every 5-7.  Membranes stripped when last checked was 4.  Started bleeding- after exam, small amt.

## 2012-11-09 NOTE — MAU Note (Signed)
Pt states was 4cm in office around 1130, here for ctx's q5-7 min apart. Denies bleeding or lof.

## 2012-11-09 NOTE — Progress Notes (Signed)
Membranes stripped.  Aftercare instructions givn BP weight and urine results all reviewed and noted. Patient reports good fetal movement, denies any bleeding and no rupture of membranes symptoms or regular contractions. Patient is without complaints. All questions were answered.

## 2012-11-10 ENCOUNTER — Encounter (HOSPITAL_COMMUNITY): Payer: Self-pay | Admitting: Anesthesiology

## 2012-11-10 ENCOUNTER — Inpatient Hospital Stay (HOSPITAL_COMMUNITY): Payer: 59 | Admitting: Anesthesiology

## 2012-11-10 ENCOUNTER — Encounter (HOSPITAL_COMMUNITY)
Admission: AD | Disposition: A | Discharge status: Home / Self Care | Payer: Self-pay | Source: Ambulatory Visit | Attending: Obstetrics & Gynecology

## 2012-11-10 ENCOUNTER — Encounter (HOSPITAL_COMMUNITY): Payer: 59 | Admitting: Anesthesiology

## 2012-11-10 DIAGNOSIS — Z302 Encounter for sterilization: Secondary | ICD-10-CM

## 2012-11-10 HISTORY — PX: TUBAL LIGATION: SHX77

## 2012-11-10 LAB — CBC
HCT: 29.8 % — ABNORMAL LOW (ref 36.0–46.0)
HCT: 33.9 % — ABNORMAL LOW (ref 36.0–46.0)
Hemoglobin: 10.3 g/dL — ABNORMAL LOW (ref 12.0–15.0)
Hemoglobin: 11.6 g/dL — ABNORMAL LOW (ref 12.0–15.0)
MCH: 29.5 pg (ref 26.0–34.0)
MCH: 29.6 pg (ref 26.0–34.0)
MCHC: 34.2 g/dL (ref 30.0–36.0)
MCHC: 34.6 g/dL (ref 30.0–36.0)
MCV: 85.6 fL (ref 78.0–100.0)
MCV: 86.3 fL (ref 78.0–100.0)
Platelets: 177 10*3/uL (ref 150–400)
Platelets: 190 10*3/uL (ref 150–400)
RBC: 3.48 MIL/uL — ABNORMAL LOW (ref 3.87–5.11)
RBC: 3.93 MIL/uL (ref 3.87–5.11)
RDW: 13 % (ref 11.5–15.5)
RDW: 13.3 % (ref 11.5–15.5)
WBC: 20.7 10*3/uL — ABNORMAL HIGH (ref 4.0–10.5)
WBC: 20.9 10*3/uL — ABNORMAL HIGH (ref 4.0–10.5)

## 2012-11-10 LAB — TYPE AND SCREEN
ABO/RH(D): O POS
Antibody Screen: NEGATIVE

## 2012-11-10 LAB — RPR: RPR Ser Ql: NONREACTIVE

## 2012-11-10 LAB — SURGICAL PCR SCREEN
MRSA, PCR: NEGATIVE
Staphylococcus aureus: POSITIVE — AB

## 2012-11-10 LAB — ABO/RH: ABO/RH(D): O POS

## 2012-11-10 SURGERY — LIGATION, FALLOPIAN TUBE, POSTPARTUM
Anesthesia: Spinal | Site: Abdomen | Laterality: Bilateral | Wound class: Clean Contaminated

## 2012-11-10 MED ORDER — PRENATAL MULTIVITAMIN CH
1.0000 | ORAL_TABLET | Freq: Every day | ORAL | Status: DC
  Administered 2012-11-11: 1 via ORAL
  Filled 2012-11-10: qty 1

## 2012-11-10 MED ORDER — ONDANSETRON HCL 4 MG PO TABS: 4.0000 mg | ORAL_TABLET | ORAL | Status: DC | PRN

## 2012-11-10 MED ORDER — BUPIVACAINE IN DEXTROSE 0.75-8.25 % IT SOLN
INTRATHECAL | Status: DC | PRN
  Administered 2012-11-10: 12 mg via INTRATHECAL

## 2012-11-10 MED ORDER — MEASLES, MUMPS & RUBELLA VAC ~~LOC~~ INJ
0.5000 mL | INJECTION | Freq: Once | SUBCUTANEOUS | Status: DC
  Filled 2012-11-10: qty 0.5

## 2012-11-10 MED ORDER — ZOLPIDEM TARTRATE 5 MG PO TABS: 5.0000 mg | ORAL_TABLET | Freq: Every evening | ORAL | Status: DC | PRN

## 2012-11-10 MED ORDER — FAMOTIDINE 20 MG PO TABS
40.0000 mg | ORAL_TABLET | Freq: Once | ORAL | Status: AC
  Administered 2012-11-10: 40 mg via ORAL
  Filled 2012-11-10: qty 2

## 2012-11-10 MED ORDER — MEPERIDINE HCL 25 MG/ML IJ SOLN: 6.2500 mg | INTRAMUSCULAR | Status: DC | PRN

## 2012-11-10 MED ORDER — DIPHENHYDRAMINE HCL 25 MG PO CAPS: 25.0000 mg | ORAL_CAPSULE | Freq: Four times a day (QID) | ORAL | Status: DC | PRN

## 2012-11-10 MED ORDER — BENZOCAINE-MENTHOL 20-0.5 % EX AERO
1.0000 "application " | INHALATION_SPRAY | CUTANEOUS | Status: DC | PRN
  Filled 2012-11-10: qty 56

## 2012-11-10 MED ORDER — FENTANYL CITRATE 0.05 MG/ML IJ SOLN: 25.0000 ug | INTRAMUSCULAR | Status: DC | PRN

## 2012-11-10 MED ORDER — BUPIVACAINE HCL (PF) 0.5 % IJ SOLN
INTRAMUSCULAR | Status: AC
  Filled 2012-11-10: qty 30

## 2012-11-10 MED ORDER — TETANUS-DIPHTH-ACELL PERTUSSIS 5-2.5-18.5 LF-MCG/0.5 IM SUSP: 0.5000 mL | Freq: Once | INTRAMUSCULAR | Status: DC

## 2012-11-10 MED ORDER — DIBUCAINE 1 % RE OINT: 1.0000 "application " | TOPICAL_OINTMENT | RECTAL | Status: DC | PRN

## 2012-11-10 MED ORDER — FERROUS SULFATE 325 (65 FE) MG PO TABS
325.0000 mg | ORAL_TABLET | Freq: Two times a day (BID) | ORAL | Status: DC
  Administered 2012-11-10 – 2012-11-11 (×2): 325 mg via ORAL
  Filled 2012-11-10 (×2): qty 1

## 2012-11-10 MED ORDER — METOCLOPRAMIDE HCL 5 MG/ML IJ SOLN: 10.0000 mg | Freq: Once | INTRAMUSCULAR | Status: DC | PRN

## 2012-11-10 MED ORDER — LACTATED RINGERS IV SOLN
INTRAVENOUS | Status: DC
  Administered 2012-11-10: 14:00:00 via INTRAVENOUS

## 2012-11-10 MED ORDER — BUPIVACAINE HCL 0.5 % IJ SOLN
INTRAMUSCULAR | Status: DC | PRN
  Administered 2012-11-10: 20 mL

## 2012-11-10 MED ORDER — SIMETHICONE 80 MG PO CHEW: 80.0000 mg | CHEWABLE_TABLET | ORAL | Status: DC | PRN

## 2012-11-10 MED ORDER — METOCLOPRAMIDE HCL 10 MG PO TABS
10.0000 mg | ORAL_TABLET | Freq: Once | ORAL | Status: AC
  Administered 2012-11-10: 10 mg via ORAL
  Filled 2012-11-10: qty 1

## 2012-11-10 MED ORDER — ONDANSETRON HCL 4 MG/2ML IJ SOLN: 4.0000 mg | INTRAMUSCULAR | Status: DC | PRN

## 2012-11-10 MED ORDER — LANOLIN HYDROUS EX OINT: 1.0000 "application " | TOPICAL_OINTMENT | CUTANEOUS | Status: DC | PRN

## 2012-11-10 MED ORDER — IBUPROFEN 600 MG PO TABS
600.0000 mg | ORAL_TABLET | Freq: Four times a day (QID) | ORAL | Status: DC
  Administered 2012-11-10 – 2012-11-11 (×5): 600 mg via ORAL
  Filled 2012-11-10 (×5): qty 1

## 2012-11-10 MED ORDER — MAGNESIUM HYDROXIDE 400 MG/5ML PO SUSP: 30.0000 mL | ORAL | Status: DC | PRN

## 2012-11-10 MED ORDER — WITCH HAZEL-GLYCERIN EX PADS: 1.0000 "application " | MEDICATED_PAD | CUTANEOUS | Status: DC | PRN

## 2012-11-10 MED ORDER — MIDAZOLAM HCL 2 MG/2ML IJ SOLN
INTRAMUSCULAR | Status: AC
  Filled 2012-11-10: qty 2

## 2012-11-10 MED ORDER — OXYCODONE-ACETAMINOPHEN 5-325 MG PO TABS: 1.0000 | ORAL_TABLET | ORAL | Status: DC | PRN

## 2012-11-10 MED ORDER — SENNOSIDES-DOCUSATE SODIUM 8.6-50 MG PO TABS
2.0000 | ORAL_TABLET | ORAL | Status: DC
  Filled 2012-11-10: qty 2

## 2012-11-10 SURGICAL SUPPLY — 18 items
CATH ROBINSON RED A/P 16FR (CATHETERS) ×2 IMPLANT
CHLORAPREP W/TINT 26ML (MISCELLANEOUS) ×2 IMPLANT
CLIP FILSHIE TUBAL LIGA STRL (Clip) ×2 IMPLANT
CLOTH BEACON ORANGE TIMEOUT ST (SAFETY) ×2 IMPLANT
GLOVE ECLIPSE 7.0 STRL STRAW (GLOVE) ×2 IMPLANT
GLOVE INDICATOR 7.0 STRL GRN (GLOVE) ×4 IMPLANT
GOWN PREVENTION PLUS LG XLONG (DISPOSABLE) ×2 IMPLANT
GOWN STRL REIN XL XLG (GOWN DISPOSABLE) ×2 IMPLANT
NEEDLE HYPO 25X1 1.5 SAFETY (NEEDLE) IMPLANT
NS IRRIG 1000ML POUR BTL (IV SOLUTION) ×2 IMPLANT
PACK ABDOMINAL MINOR (CUSTOM PROCEDURE TRAY) ×2 IMPLANT
SPONGE GAUZE 2X2 8PLY STRL LF (GAUZE/BANDAGES/DRESSINGS) ×2 IMPLANT
SUT VIC AB 0 CT1 27 (SUTURE) ×2
SUT VIC AB 0 CT1 27XBRD ANBCTR (SUTURE) ×1 IMPLANT
SUT VIC AB 4-0 PS2 27 (SUTURE) ×2 IMPLANT
SYR CONTROL 10ML LL (SYRINGE) IMPLANT
TOWEL OR 17X24 6PK STRL BLUE (TOWEL DISPOSABLE) ×4 IMPLANT
WATER STERILE IRR 1000ML POUR (IV SOLUTION) ×2 IMPLANT

## 2012-11-10 NOTE — Anesthesia Preprocedure Evaluation (Signed)
Anesthesia Evaluation  Patient identified by MRN, date of birth, ID band Patient awake    Reviewed: Allergy & Precautions, H&P , NPO status , Patient's Chart, lab work & pertinent test results  Airway Mallampati: II TM Distance: >3 FB Neck ROM: Full    Dental no notable dental hx. (+) Teeth Intact   Pulmonary neg pulmonary ROS,  breath sounds clear to auscultation  Pulmonary exam normal       Cardiovascular negative cardio ROS  Rhythm:Regular Rate:Normal     Neuro/Psych PSYCHIATRIC DISORDERS Anxiety Depression Hx/o Panic attacksnegative neurological ROS     GI/Hepatic negative GI ROS, Neg liver ROS,   Endo/Other  negative endocrine ROS  Renal/GU negative Renal ROS  negative genitourinary   Musculoskeletal negative musculoskeletal ROS (+)   Abdominal   Peds  Hematology negative hematology ROS (+)   Anesthesia Other Findings   Reproductive/Obstetrics Desires Permanent Contraception                           Anesthesia Physical Anesthesia Plan  ASA: II  Anesthesia Plan: Spinal   Post-op Pain Management:    Induction:   Airway Management Planned: Natural Airway  Additional Equipment:   Intra-op Plan:   Post-operative Plan:   Informed Consent: I have reviewed the patients History and Physical, chart, labs and discussed the procedure including the risks, benefits and alternatives for the proposed anesthesia with the patient or authorized representative who has indicated his/her understanding and acceptance.     Plan Discussed with: Anesthesiologist, CRNA and Surgeon  Anesthesia Plan Comments:         Anesthesia Quick Evaluation

## 2012-11-10 NOTE — Anesthesia Procedure Notes (Signed)
Spinal  Patient location during procedure: OR Start time: 11/10/2012 2:22 PM Staffing Anesthesiologist: Bernadett Milian A. Performed by: anesthesiologist  Preanesthetic Checklist Completed: patient identified, site marked, surgical consent, pre-op evaluation, timeout performed, IV checked, risks and benefits discussed and monitors and equipment checked Spinal Block Patient position: sitting Prep: site prepped and draped and DuraPrep Patient monitoring: heart rate, cardiac monitor, continuous pulse ox and blood pressure Approach: midline Location: L3-4 Injection technique: single-shot Needle Needle type: Sprotte  Needle gauge: 24 G Needle length: 9 cm Needle insertion depth: 5 cm Assessment Sensory level: T4 Additional Notes Patient tolerated procedure well. Adequate sensory level.

## 2012-11-10 NOTE — Transfer of Care (Signed)
Immediate Anesthesia Transfer of Care Note  Patient: Madison Golden  Procedure(s) Performed: Procedure(s): POST PARTUM TUBAL LIGATION (Bilateral)  Patient Location: PACU  Anesthesia Type:Spinal  Level of Consciousness: awake, alert  and oriented  Airway & Oxygen Therapy: Patient Spontanous Breathing  Post-op Assessment: Report given to PACU RN and Post -op Vital signs reviewed and stable  Post vital signs: stable  Complications: No apparent anesthesia complications

## 2012-11-10 NOTE — Anesthesia Postprocedure Evaluation (Signed)
Anesthesia Post Note  Patient: Madison Golden  Procedure(s) Performed: Procedure(s) (LRB): POST PARTUM TUBAL LIGATION (Bilateral)  Anesthesia type: Spinal  Patient location: Mother/Baby  Post pain: Pain level controlled  Post assessment: Post-op Vital signs reviewed  Last Vitals:  Filed Vitals:   11/10/12 1900  BP: 99/62  Pulse: 63  Temp: 36.6 C  Resp: 20    Post vital signs: Reviewed  Level of consciousness: awake  Complications: No apparent anesthesia complications

## 2012-11-10 NOTE — Brief Op Note (Signed)
11/09/2012 - 11/10/2012  2:55 PM  PATIENT:  Madison Golden  32 y.o. female  PRE-OPERATIVE DIAGNOSIS:  Desires Sterilization  POST-OPERATIVE DIAGNOSIS:  desires sterilization  PROCEDURE:  Procedure(s): POST PARTUM TUBAL LIGATION (Bilateral)  SURGEON:  Surgeon(s) and Role:    * Willodean Rosenthal, MD - Primary  ANESTHESIA:   spinal  EBL:  Total I/O In: 400 [I.V.:400] Out: -   BLOOD ADMINISTERED:none  DRAINS: none   LOCAL MEDICATIONS USED:  MARCAINE     SPECIMEN:  No Specimen  DISPOSITION OF SPECIMEN:  N/A  COUNTS:  YES  TOURNIQUET:  * No tourniquets in log *  DICTATION: .Note written in EPIC  PLAN OF CARE: Admit to inpatient   PATIENT DISPOSITION:  PACU - hemodynamically stable.   Delay start of Pharmacological VTE agent (>24hrs) due to surgical blood loss or risk of bleeding: not applicable

## 2012-11-10 NOTE — Anesthesia Postprocedure Evaluation (Signed)
  Anesthesia Post-op Note  Patient: Madison Golden  Procedure(s) Performed: Procedure(s): POST PARTUM TUBAL LIGATION (Bilateral)  Patient Location: PACU  Anesthesia Type:Spinal  Level of Consciousness: awake, alert  and oriented  Airway and Oxygen Therapy: Patient Spontanous Breathing  Post-op Pain: none  Post-op Assessment: Post-op Vital signs reviewed, Patient's Cardiovascular Status Stable, Respiratory Function Stable, Patent Airway, No signs of Nausea or vomiting, Pain level controlled, No headache and No backache  Post-op Vital Signs: Reviewed and stable  Complications: No apparent anesthesia complications

## 2012-11-10 NOTE — Progress Notes (Signed)
Post Partum Day 1 Subjective: no complaints NSVD 11/09/12 at 22:25. Patient currently NPO and scheduled for tubal ligation 11/4.  Objective: Blood pressure 100/67, pulse 81, temperature 98.3 F (36.8 C), temperature source Oral, resp. rate 16, height 5\' 4"  (1.626 m), weight 73.483 kg (162 lb), last menstrual period 01/22/2012, SpO2 97.00%, unknown if currently breastfeeding.  Physical Exam:  General: alert, cooperative, appears stated age and no distress Lochia: appropriate Uterine Fundus: firm Incision: NA DVT Evaluation: No evidence of DVT seen on physical exam. No cords or calf tenderness. No significant calf/ankle edema. Distal Pulses intact with regular rate and contour. Cardiac: Normal S1 and S2; no S3/S4, murmur, or rub. Lungs: Clear bilaterally to auscultation.   Recent Labs  11/09/12 2335 11/10/12 0605  HGB 11.6* 10.3*  HCT 33.9* 29.8*   2hr GTT 130 Assessment/Plan: Plan for discharge tomorrow, Breastfeeding and Contraception tubal ligation - scheduled today.   LOS: 1 day   Jefm Petty 11/10/2012, 7:16 AM   I have seen and examined this patient and agree with above documentation in the PA student's note.   Rulon Abide, M.D. [facility] Fellow 11/10/2012 10:16 AM

## 2012-11-10 NOTE — Op Note (Signed)
Madison Golden 11/09/2012 - 11/10/2012  PREOPERATIVE DIAGNOSIS:  Multiparity, undesired fertility  POSTOPERATIVE DIAGNOSIS:  Multiparity, undesired fertility  PROCEDURE:  Postpartum Bilateral Tubal Sterilization using Filshie Clips   ANESTHESIA:  Epidural and local analgesia using 0.5% Marcaine  COMPLICATIONS:  None immediate.  ESTIMATED BLOOD LOSS: 5 ml.  INDICATIONS: 32 y.o. [id number]  with undesired fertility,status post vaginal delivery, desires permanent sterilization.  Other reversible forms of contraception were discussed with patient; she declines all other modalities. Risks of procedure discussed with patient including but not limited to: risk of regret, permanence of method, bleeding, infection, injury to surrounding organs and need for additional procedures.  Failure risk of 0.5-1% with increased risk of ectopic gestation if pregnancy occurs was also discussed with patient.     FINDINGS:  Normal uterus, tubes, and ovaries.  PROCEDURE DETAILS: The patient was taken to the operating room where her epidural anesthesia was dosed up to surgical level and found to be adequate.  She was then placed in the dorsal supine position and prepped and draped in sterile fashion.  After an adequate timeout was performed, attention was turned to the patient's abdomen where a small transverse skin incision was made under the umbilical fold. The incision was taken down to the layer of fascia using the scalpel, and fascia was incised, and extended bilaterally using Mayo scissors. The peritoneum was entered in a sharp fashion. Attention was then turned to the patient's uterus, and left fallopian tube was identified and followed out to the fimbriated end.  A Filshie clip was placed on the left fallopian tube about 3 cm from the cornual attachment, with care given to incorporate the underlying mesosalpinx.  A similar process was carried out on the right side allowing for bilateral tubal sterilization.  Good  hemostasis was noted overall.  The instruments were then removed from the patient's abdomen and the fascial incision was repaired with 0 Vicryl, and the skin was closed with a 4-0 Vicryl subcuticular stitch. 20cc of 0.5% Marcaine was injected into the incision. The patient tolerated the procedure well.  Instrument, sponge, and needle counts were correct times two.  The patient was then taken to the recovery room awake and in stable condition.

## 2012-11-10 NOTE — H&P (Signed)
Madison Golden is a 32 y.o. female presenting to [facility] in transition with broken water; pregnancy dated at [redacted]w[redacted]d by [place]. Patient was sent home earlier today and returned to [facility]U once contractions became more intense.  High Risk Pregnancy Summary:  \FOB: Madison Golden, 32, [facility]  Pap normal  Genetic Screen NT/IT: neg  Anatomic [place] Lt EICF re-check at 28wks-resolved, female 'Madison Golden'  Glucose Screen 2hr 72/146/130  Flu vaccine   CF Screen   GC/CT Initial:  -/-              36+wks:   GBS neg  Feed Preference breast  Contraception  BTL--YES.Wants BTL in hospital, day after delivery or at time of c-section  Circumcision n/a  Childbirth Classes Healthy Pregnancy Classes  Pediatrician Dr. Conni Elliot- Jonita Albee   [name] has the following problem list.  Patient Active Problem List   Diagnosis Date Noted  . Contraceptive management 09/04/2012  . H/O preterm delivery, currently pregnant 07/21/2012  . Supervision of high-risk pregnancy 04/29/2012  . History of cesarean delivery, currently pregnant 04/29/2012  . H/O laser treatment of cervix complicating pregnancy 04/29/2012  . ANXIETY DISORDER 12/28/2008  . TOBACCO ABUSE 12/28/2008  . PALPITATIONS 12/28/2008     Maternal Medical History:  Reason for admission: Rupture of membranes and contractions.  Nausea.  Contractions: Onset was 1-2 hours ago.   Frequency: regular.   Duration is approximately 1 minute.   Perceived severity is strong.    Fetal activity: Perceived fetal activity is normal.   Last perceived fetal movement was within the past hour.    Prenatal complications: Preterm labor (prior history of) and substance abuse (tobacco abuse).   No bleeding, cholelithiasis, HIV, hypertension, infection, IUGR, nephrolithiasis, oligohydramnios, placental abnormality, polyhydramnios, pre-eclampsia, thrombocytopenia or thrombophilia.   Prenatal Complications - Diabetes: none.    OB History   Grav Para Term Preterm Abortions TAB  SAB Ect Mult Living   3 2 1 1      2      Past Medical History  Diagnosis Date  . Palpitations     Event recorder, 01/2009 NSR, sinus tachy  . Depression   . Sinus tachycardia   . Panic attacks   . Mass of breast, left   . Echocardiogram abnormal 12/2008    EF 55-60%  . Abnormal Pap smear   . Endometriosis   . Seasonal allergies    Past Surgical History  Procedure Laterality Date  . Cesarean section    . Laparoscopic endometriosis fulguration  2009  . Leep  2012   Family History: family history includes Cancer in her maternal grandmother; Diabetes in her maternal grandmother; Hypertension in her maternal grandmother; Stroke in her paternal grandmother. There is no history of Coronary artery disease. Social History:  reports that she has never smoked. She has never used smokeless tobacco. She reports that she does not drink alcohol or use illicit drugs.   Prenatal Transfer Tool  Maternal Diabetes: No Genetic Screening: Normal Maternal Ultrasounds/Referrals: Normal Fetal Ultrasounds or other Referrals:  None Maternal Substance Abuse:  Yes:  Type: Smoker Significant Maternal Medications:  None Significant Maternal Lab Results:  None Other Comments:  None  Review of Systems  Constitutional: Negative.  Negative for fever, chills, weight loss, malaise/fatigue and diaphoresis.  HENT: Negative.   Eyes: Negative.   Respiratory: Negative.  Negative for shortness of breath.   Cardiovascular: Negative.  Negative for chest pain and orthopnea.  Gastrointestinal: Positive for abdominal pain (contractions - normal).  Negative for heartburn, nausea and vomiting.  Genitourinary: Negative.   Skin: Negative.   Neurological: Negative.  Negative for weakness.  Psychiatric/Behavioral: Negative.        ANX history    Dilation: 10 Effacement (%): 80 Station: +1 Exam by:: Carita Sollars, v Blood pressure 107/57, pulse 86, temperature 98.1 F (36.7 C), temperature source Oral, resp. rate 18, height  5\' 4"  (1.626 m), weight 73.483 kg (162 lb), last menstrual period 01/22/2012, unknown if currently breastfeeding. Maternal Exam:  Uterine Assessment: Contraction strength is firm.  Contraction duration is 1 minute. Contraction frequency is regular.   Abdomen: Patient reports no abdominal tenderness. Fetal presentation: vertex  Introitus: Normal vulva. Normal vagina.  Ferning test: not done.  Nitrazine test: not done. Amniotic fluid character: clear.  Pelvis: adequate for delivery.   Cervix: Cervix evaluated by digital exam.     Physical Exam : Deferred while pushing. Prenatal labs: ABO, Rh: --/--/O POS (11/03 1610) Antibody: PENDING (11/03 2335) Rubella: 3.66 (03/26 1150) RPR: NON REAC (08/13 0937)  HBsAg: NEGATIVE (03/26 1150)  HIV: NON REACTIVE (08/13 9604)  GBS: NEGATIVE (10/13 1229)   Assessment/Plan: NSVD NPO at 00:00 11/10/12 with tubal ligation following afternoon. Breastfeeding  Jefm Petty 11/10/2012, 12:38 AM  I was present for the exam and agree with above.  Dorathy Kinsman, CNM 11/10/2012 9:07 AM  .

## 2012-11-11 ENCOUNTER — Encounter (HOSPITAL_COMMUNITY): Payer: Self-pay | Admitting: Obstetrics & Gynecology

## 2012-11-11 MED ORDER — LANOLIN HYDROUS EX OINT: 1.0000 "application " | TOPICAL_OINTMENT | CUTANEOUS | Status: DC | PRN

## 2012-11-11 MED ORDER — IBUPROFEN 600 MG PO TABS: 600.0000 mg | ORAL_TABLET | Freq: Four times a day (QID) | ORAL | Status: DC

## 2012-11-11 MED ORDER — OXYCODONE-ACETAMINOPHEN 5-325 MG PO TABS: 1.0000 | ORAL_TABLET | ORAL | Status: DC | PRN

## 2012-11-11 NOTE — Discharge Summary (Signed)
Obstetric Discharge Summary Reason for Admission: onset of labor and rupture of membranes Prenatal Procedures: ultrasound Intrapartum Procedures: spontaneous vaginal delivery Postpartum Procedures: Tubal Ligation - POD 1 Complications-Operative and Postpartum: none Hemoglobin  Date Value Range Status  11/10/2012 10.3* 12.0 - 15.0 g/dL Final     HCT  Date Value Range Status  11/10/2012 29.8* 36.0 - 46.0 % Final    Physical Exam:  General: alert, cooperative, appears stated age and no distress Lochia: appropriate Uterine Fundus: firm Incision: healing well; bandage in place and no warmth, edema, erythema noted around TL incision. DVT Evaluation: No evidence of DVT seen on physical exam. No cords or calf tenderness.  No significant calf/ankle edema; distal pedal pulses present bilaterally with regular rate and good contour.  Discharge Diagnoses: Term Pregnancy-delivered and post tubal ligation.  Discharge Information: Date: 11/11/2012 Activity: unrestricted Diet: routine Medications: Percocet Condition: stable and improved Instructions: refer to practice specific booklet Discharge to: home  Madison Golden is a 32 y.o. [phone] with an uncomplicated NSVD on 11/09/13. She is post tubal ligation day one, healing well,tolerating PO, and has passed flatus. She has no concerns at this time and is "ready to go home;" her infant is feeding well and the couple appears to be well-bonded. FOB has been present and engaged throughout the process.. She has no evidence of DVT on exam. Her cardiac exam yields normal S1/S2 and rate with no additional heart sounds, rubs, or murmurs. Lungs are clear to auscultation bilaterally.   Newborn Data: Live born female  Birth Weight: 7 lb 14.5 oz (3586 g) APGAR: 8, 9  Home with mother.  Jefm Petty 11/11/2012, 7:52 AM  I have seen and examined this patient and agree with above documentation in the PA student's note.   Rulon Abide, M.D. [facility]  Fellow 11/11/2012 9:09 AM

## 2012-11-11 NOTE — Progress Notes (Signed)
Patient reports tdap received at work about 2 years ago; asks if she should get another before discharge.

## 2012-11-16 ENCOUNTER — Encounter: Payer: 59 | Admitting: Women's Health

## 2012-11-27 ENCOUNTER — Telehealth: Payer: Self-pay | Admitting: *Deleted

## 2012-11-27 NOTE — Telephone Encounter (Addendum)
Spoke with pt. Was on Xanax before she got pregnant. She is breastfeeding and feels like she needs something for anxiety. Don't feel suicidal or

## 2012-11-27 NOTE — Telephone Encounter (Signed)
Spoke with pt. Was on Xanax before she got pregnant. Now is breastfeeding but feels like she needs something for anxiety. Don't feel suicidal or depressed. Has postpartum visit scheduled for 12/21/12. Pt has checked BP at home,  112/60, 140/80,128/80. Advised BP readings look good, but to continue to check them over the weekend and if she gets a high reading, call back Monday. Can you order something for anxiety that is safe with breastfeeding? Uses [facility]. Thanks!!!

## 2012-11-27 NOTE — Telephone Encounter (Signed)
There are not acceptable anti anxiety meds for breastfeeding

## 2012-11-27 NOTE — Telephone Encounter (Signed)
Spoke with Dr. Despina Hidden. There is no anxiety med that is safe with breastfeeding. Offered to schedule an appt so pt could come in and talk to one of the providers. Pt states she can't take anti-depressant meds. At this time she wants to wait and see how she does. Advised if she gets worse over the weekend, go to the nearest ER. Pt voiced understanding. JSY

## 2012-11-27 NOTE — Telephone Encounter (Signed)
Pt informed no anti anxiety meds safe with breastfeeding. JSY

## 2012-12-11 NOTE — OR Nursing (Signed)
Cases ended at 14:15.

## 2012-12-21 ENCOUNTER — Encounter: Payer: Self-pay | Admitting: Women's Health

## 2012-12-21 ENCOUNTER — Ambulatory Visit (INDEPENDENT_AMBULATORY_CARE_PROVIDER_SITE_OTHER): Payer: 59 | Admitting: Women's Health

## 2012-12-21 ENCOUNTER — Telehealth: Payer: Self-pay | Admitting: Women's Health

## 2012-12-21 VITALS — BP 148/100 | Ht 63.0 in | Wt 140.5 lb

## 2012-12-21 DIAGNOSIS — Z3480 Encounter for supervision of other normal pregnancy, unspecified trimester: Secondary | ICD-10-CM

## 2012-12-21 DIAGNOSIS — I10 Essential (primary) hypertension: Secondary | ICD-10-CM

## 2012-12-21 DIAGNOSIS — F419 Anxiety disorder, unspecified: Secondary | ICD-10-CM

## 2012-12-21 MED ORDER — VENLAFAXINE HCL ER 37.5 MG PO CP24: 37.5000 mg | ORAL_CAPSULE | Freq: Every day | ORAL | Status: DC

## 2012-12-21 MED ORDER — AMLODIPINE BESYLATE 5 MG PO TABS: 5.0000 mg | ORAL_TABLET | Freq: Every day | ORAL | Status: DC

## 2012-12-21 NOTE — Progress Notes (Signed)
Patient ID: Madison Golden, female   DOB: [birth year], 32 y.o.   MRN: [id number] Subjective:    Madison Golden is a 32 y.o. [phone] Caucasian female who presents for a postpartum visit. She is 6 weeks postpartum following a vaginal birth after cesarean (VBAC) at 39.4 gestational weeks. Anesthesia: none. I have fully reviewed the prenatal and intrapartum course. Postpartum course has been complicated by anxiety. Baby's course has been uncomplicated. Baby is feeding by breast. Bleeding no bleeding. Bowel function is normal.  Bladder function is normal. Patient is not sexually active. Contraception method is tubal ligation.  Postpartum depression screening: positive. Score 12. She has a h/o pp anxiety after birth of 1st child, did not experience after 2nd child. 'Felt great' during this pregnancy, however she reports feeling very anxious since birth of this child, worries all the time that something is wrong w/ her or something may happen to her and she wouldn't be here for her kids. Reports taking her bp at home a few weeks ago and it was slightly elevated for her, and she began to worry about that as well. States it kept getting higher the more worried she got about it and kept taking it. No h/o CHTN or HTN during pregnancy. Denies depression. Doesn't cry easily. Able to sleep, finds joy in things she used to. Decreased appetite. She denies SI/HI/II.  Played softball with her family yesterday and that made her feel great. Feels like she has just had too much time to sit around and think about stuff to make her worry since she's been out of work for maternity leave. Planning on returning to work 01/18/13. Has tried multiple antidepressants including lexapro and zoloft, in past that made anxiety worse. Xanax, rx'd by her PCP, is the only thing that helped previously. She also used to see a Recruitment consultant, but counselor moved and she hasn't started seeing anyone else.  Dr. Emelda Fear spoke w/ pt regarding symptoms-  recommended effexor and norvasc.  The following portions of the patient's history were reviewed and updated as appropriate: allergies, current medications, past medical history, past surgical history and problem list.  Review of Systems Pertinent items are noted in HPI.   Filed Vitals:   12/21/12 0945  BP: 148/100  Height: 5\' 3"  (1.6 m)  Weight: 140 lb 8 oz (63.73 kg)    Objective:     General:  alert, cooperative and no distress   Breasts:  deferred, no complaints  Lungs: clear to auscultation bilaterally  Heart:  regular rate and rhythm  Abdomen: soft, nontender   Vulva: normal  Vagina: normal vagina  Cervix:  closed  Corpus: Well-involuted  Adnexa:  Non-palpable  Rectal Exam: No hemorrhoids       Extremities: no edema, DTRs 1-2+, no clonus  Assessment:   Postpartum exam 6 wks s/p successful VBAC and pp BTL Depression screening Anxiety Elevated bp today Breastfeeding  Plan:  Rx: Effexor 37.5mg  daily, can increase to 75mg  after 4-7d if needed Rx: Norvasc 5mg  daily Discussed stress relieving activities: walking, reading, warm baths, yoga Gave printed info on anxiety Recommended calling [facility] psychiatrist in [place] to reinitiate counseling-info given Contraception: s/p BTL Follow up in: 1 week for f/u, check bp, or sooner if needed.   Marge Duncans 12/21/2012 10:20 AM

## 2012-12-21 NOTE — Patient Instructions (Signed)

## 2012-12-21 NOTE — Telephone Encounter (Signed)
Informed Madison Golden I had researched Effexor further and manufacturer does not recommend w/ breastfeeding, so she is not to take it. She reports having taken paxil, zoloft, lexapro, among many other antidepressants in the past and they just made anxiety worse.  She states she really doesn't like taking medications, especially while breastfeeding, and is also concerned about taking Norvasc and would rather not. Recommended that she begin stress/anxiety reducing activities such as walking, reading, yoga, warm baths, etc. Limit salt intake. Take bp few times during the day, if >140/90, she should take norvasc as directed. She should call psychiatrist in [place] today to schedule appt to reinitiate counseling.  She has a f/u appt in 1 week. She is to call if she needs anything before then.   Cheral Marker, CNM, WHNP-BC 12/21/2012 2:26 PM

## 2012-12-22 ENCOUNTER — Telehealth: Payer: Self-pay | Admitting: Women's Health

## 2012-12-22 DIAGNOSIS — F419 Anxiety disorder, unspecified: Secondary | ICD-10-CM

## 2012-12-22 MED ORDER — LORAZEPAM 0.5 MG PO TABS: 0.5000 mg | ORAL_TABLET | Freq: Two times a day (BID) | ORAL | Status: DC

## 2012-12-22 NOTE — Telephone Encounter (Signed)
Pt called stating she's been checking bp and is <140/90, so she hasn't taken norvasc. She is also contemplating quitting breastfeeding to restart xanax d/t anxiety, but really doesn't want to quit breastfeeding. Discussed option of lorazepam as safer than xanax w/ breastfeeding, however it still may cause sedation and withdrawal symptoms in infant. Pt interested in trying this. Will take in am and pm immediately after she feeds baby. She has frozen milk stored, so will pump and dump for 1-2 feedings during the day. To observe for sedation or other side effects in infant and pump and dump accordingly. Pt is to let pediatrician know she is taking lorazepam and breastfeeding.  Rx: lorazepam 0.5mg  BID # 60 w/ 1 RF  Pt to keep f/u in 1wk  Cheral Marker, CNM, [facility] 12/22/2012 9:50 AM

## 2012-12-22 NOTE — Telephone Encounter (Signed)
Pt states saw Joellyn Haff, CNM yesterday was given blood pressure medication, Norvasc. Pt states can she take Norvasc and Xanax together and what are the common side effects. Call transferred to Joellyn Haff, CNM.

## 2012-12-28 ENCOUNTER — Ambulatory Visit (INDEPENDENT_AMBULATORY_CARE_PROVIDER_SITE_OTHER): Payer: 59 | Admitting: Women's Health

## 2012-12-28 ENCOUNTER — Encounter: Payer: Self-pay | Admitting: Women's Health

## 2012-12-28 VITALS — BP 132/84 | Ht 63.0 in | Wt 138.0 lb

## 2012-12-28 DIAGNOSIS — F419 Anxiety disorder, unspecified: Secondary | ICD-10-CM

## 2012-12-28 DIAGNOSIS — O99345 Other mental disorders complicating the puerperium: Secondary | ICD-10-CM

## 2012-12-28 DIAGNOSIS — F411 Generalized anxiety disorder: Secondary | ICD-10-CM

## 2012-12-28 NOTE — Progress Notes (Signed)
Patient ID: Madison Golden, female   DOB: [birth year], 32 y.o.   MRN: [id number] Madison Golden is a 32 y.o. [phone] female here for f/u from 12/16 postpartum visit where her bp was elevated and she was having problems with anxiety.  She was rx'd norvasc at that visit, but she has been taking her bp at home and it has never been >140/90, so she has not taken it. She was also rx'd ativan 0.5mg  bid for anxiety, she states she took one and it made her sleepy, so she has actually been cutting the pill in 1/2 and taking 0.25mg  bid and it does not make her sleepy and helps w/ her anxiety. She is still breastfeeding, so she nurses baby in am, pumps, and then takes the ativan. She feeds the baby the pumped milk at next feeding, and then the feeding after that she pumps and dumps. She reports that the baby has not exhibited any signs of abnormal sleepiness/sedation.  Her last pap was in Jan and was neg, but states she has a h/o abnormal pap w/ LEEP, and prefers to have yearly paps.   O: BP 132/84  Ht 5\' 3"  (1.6 m)  Wt 138 lb (62.596 kg)  BMI 24.45 kg/m2  Breastfeeding? Yes   A: Anxiety     BP better today, no meds  P: F/U 3 months for pap & physical, or sooner if needed  Cheral Marker, CNM, [facility] 12/28/2012 11:31 AM

## 2013-03-30 ENCOUNTER — Encounter: Payer: Self-pay | Admitting: Women's Health

## 2013-03-30 ENCOUNTER — Ambulatory Visit (INDEPENDENT_AMBULATORY_CARE_PROVIDER_SITE_OTHER): Payer: 59 | Admitting: Women's Health

## 2013-03-30 VITALS — BP 144/78 | Ht 62.0 in | Wt 129.4 lb

## 2013-03-30 DIAGNOSIS — Z98891 History of uterine scar from previous surgery: Secondary | ICD-10-CM

## 2013-03-30 DIAGNOSIS — Z01419 Encounter for gynecological examination (general) (routine) without abnormal findings: Secondary | ICD-10-CM

## 2013-03-30 DIAGNOSIS — Z113 Encounter for screening for infections with a predominantly sexual mode of transmission: Secondary | ICD-10-CM

## 2013-03-30 LAB — CBC
HCT: 38.6 % (ref 36.0–46.0)
Hemoglobin: 13 g/dL (ref 12.0–15.0)
MCH: 29 pg (ref 26.0–34.0)
MCHC: 33.7 g/dL (ref 30.0–36.0)
MCV: 86 fL (ref 78.0–100.0)
Platelets: 264 10*3/uL (ref 150–400)
RBC: 4.49 MIL/uL (ref 3.87–5.11)
RDW: 14.2 % (ref 11.5–15.5)
WBC: 6.3 10*3/uL (ref 4.0–10.5)

## 2013-03-30 LAB — COMPREHENSIVE METABOLIC PANEL
ALT: 17 U/L (ref 0–35)
AST: 16 U/L (ref 0–37)
Albumin: 4.3 g/dL (ref 3.5–5.2)
Alkaline Phosphatase: 79 U/L (ref 39–117)
BUN: 14 mg/dL (ref 6–23)
CO2: 26 mEq/L (ref 19–32)
Calcium: 9.8 mg/dL (ref 8.4–10.5)
Chloride: 104 mEq/L (ref 96–112)
Creat: 0.76 mg/dL (ref 0.50–1.10)
Glucose, Bld: 77 mg/dL (ref 70–99)
Potassium: 4.7 mEq/L (ref 3.5–5.3)
Sodium: 142 mEq/L (ref 135–145)
Total Bilirubin: 0.4 mg/dL (ref 0.2–1.2)
Total Protein: 7.1 g/dL (ref 6.0–8.3)

## 2013-03-30 LAB — LIPID PANEL
Cholesterol: 165 mg/dL (ref 0–200)
HDL: 59 mg/dL (ref >39)
LDL Cholesterol: 96 mg/dL (ref 0–99)
Total CHOL/HDL Ratio: 2.8 Ratio
Triglycerides: 48 mg/dL (ref <150)
VLDL: 10 mg/dL (ref 0–40)

## 2013-03-30 LAB — TSH: TSH: 2.294 u[IU]/mL (ref 0.350–4.500)

## 2013-03-30 MED ORDER — HYDROCORTISONE ACE-PRAMOXINE 1-1 % RE FOAM: 1.0000 | Freq: Two times a day (BID) | RECTAL | Status: DC

## 2013-03-30 NOTE — Patient Instructions (Signed)
Hemorrhoids Hemorrhoids are swollen veins around the rectum or anus. There are two types of hemorrhoids:   Internal hemorrhoids. These occur in the veins just inside the rectum. They may poke through to the outside and become irritated and painful.  External hemorrhoids. These occur in the veins outside the anus and can be felt as a painful swelling or hard lump near the anus. CAUSES  Pregnancy.   Obesity.   Constipation or diarrhea.   Straining to have a bowel movement.   Sitting for long periods on the toilet.  Heavy lifting or other activity that caused you to strain.  Anal intercourse. SYMPTOMS   Pain.   Anal itching or irritation.   Rectal bleeding.   Fecal leakage.   Anal swelling.   One or more lumps around the anus.  DIAGNOSIS  Your caregiver may be able to diagnose hemorrhoids by visual examination. Other examinations or tests that may be performed include:   Examination of the rectal area with a gloved hand (digital rectal exam).   Examination of anal canal using a small tube (scope).   A blood test if you have lost a significant amount of blood.  A test to look inside the colon (sigmoidoscopy or colonoscopy). TREATMENT Most hemorrhoids can be treated at home. However, if symptoms do not seem to be getting better or if you have a lot of rectal bleeding, your caregiver may perform a procedure to help make the hemorrhoids get smaller or remove them completely. Possible treatments include:   Placing a rubber band at the base of the hemorrhoid to cut off the circulation (rubber band ligation).   Injecting a chemical to shrink the hemorrhoid (sclerotherapy).   Using a tool to burn the hemorrhoid (infrared light therapy).   Surgically removing the hemorrhoid (hemorrhoidectomy).   Stapling the hemorrhoid to block blood flow to the tissue (hemorrhoid stapling).  HOME CARE INSTRUCTIONS   Eat foods with fiber, such as whole grains, beans,  nuts, fruits, and vegetables. Ask your doctor about taking products with added fiber in them (fibersupplements).  Increase fluid intake. Drink enough water and fluids to keep your urine clear or pale yellow.   Exercise regularly.   Go to the bathroom when you have the urge to have a bowel movement. Do not wait.   Avoid straining to have bowel movements.   Keep the anal area dry and clean. Use wet toilet paper or moist towelettes after a bowel movement.   Medicated creams and suppositories may be used or applied as directed.   Only take over-the-counter or prescription medicines as directed by your caregiver.   Take warm sitz baths for 15 20 minutes, 3 4 times a day to ease pain and discomfort.   Place ice packs on the hemorrhoids if they are tender and swollen. Using ice packs between sitz baths may be helpful.   Put ice in a plastic bag.   Place a towel between your skin and the bag.   Leave the ice on for 15 20 minutes, 3 4 times a day.   Do not use a donut-shaped pillow or sit on the toilet for long periods. This increases blood pooling and pain.  SEEK MEDICAL CARE IF:  You have increasing pain and swelling that is not controlled by treatment or medicine.  You have uncontrolled bleeding.  You have difficulty or you are unable to have a bowel movement.  You have pain or inflammation outside the area of the hemorrhoids. MAKE SURE YOU:    Understand these instructions.  Will watch your condition.  Will get help right away if you are not doing well or get worse. Document Released: 12/22/1999 Document Revised: 12/11/2011 Document Reviewed: 10/29/2011 [employer] Patient Information 2014 [employer].  

## 2013-03-30 NOTE — Progress Notes (Signed)
Patient ID: Madison Golden, female   DOB: 01/11/1980, 33 y.o.   MRN: [id number] Subjective:     Madison Golden is a 33 y.o. [phone] Caucasian  female here for a routine well-woman exam. She is 5mths postpartum, breastfeeding.  No LMP recorded.  Current complaints: bleeding w/ bm's, bright red, no pain, no hemorrhoids she knows of. Still has anxiety, not as bad as previously, lorazepam helps but she's trying not to take as much. Had appt scheduled w/ Dr. Emerson MonteParrish McKinney in Feb, but got snowed out, so now is in May.  Smoking Status: none Does desire labs. PCP Dr. Reuel Boomaniel. States she recently went there, BP's have been fine, they are fine when she checks at home, sometimes as low as 90s/50s. Is only high when she comes to this office.   The following portions of the patient's history were reviewed and updated as appropriate: allergies, current medications, past family history, past medical history, past social history, past surgical history and problem list.   Gynecologic History No LMP recorded. Contraception: tubal ligation Last Pap: 01/24/12. Results were: normal Last mammogram: never. Results were: normal  Obstetric History OB History  Gravida Para Term Preterm AB SAB TAB Ectopic Multiple Living  3 3 2 1      3     # Outcome Date GA Lbr Len/2nd Weight Sex Delivery Anes PTL Lv  3 TRM 11/09/12 2740w4d 04:46 / 00:39 7 lb 14.5 oz (3.586 kg) F SVD Local  Y     Comments: none  2 PRE 2004 4465w0d  5 lb 8 oz (2.495 kg) F CS Spinal  Y  1 TRM 2003 3684w0d  5 lb 13 oz (2.637 kg) F SVD EPI  Y      Review of Systems Patient denies any headaches, blurred vision, shortness of breath, chest pain, abdominal pain, problems with bowel movements, urination, or intercourse.      Objective:     Physical Exam  BP 144/78  Ht 5\' 2"  (1.575 m)  Wt 129 lb 6.4 oz (58.695 kg)  BMI 23.66 kg/m2  Breastfeeding? Yes BP recheck: 130/88  General:  Well developed, well nourished, no acute distress. She is alert  and oriented x3. Skin:  Warm and dry Neck:  Midline trachea, no thyromegaly or nodules Cardiovascular: Regular rate and rhythm, no murmur heard Lungs:  Effort normal, all lung fields clear to auscultation bilaterally Breasts:  No dominant palpable mass, retraction, or nipple discharge Abdomen:  Soft, non tender, no hepatosplenomegaly or masses Pelvic:  External genitalia is normal in appearance.  The vagina is normal in appearance. The cervix is bulbous, no CMT.  Thin prep pap is not done d/t normal pap last year Uterus is felt to be normal size, shape, and contour.  No adnexal masses or tenderness noted.  Rectal: No external hemorrhoids. Possible internal hemorrhoid Extremities:  No swelling or varicosities noted Psych:  She has a normal mood and affect      Assessment:     Healthy well-woman exam Possible internal hemorrhoid, bleeding w/ BM Anxiety, on prn lorazepam Breastfeeding     Plan:  Rx proctofoam hc Continue checking bp's at home, if consistently >140/90, call office or PCP F/U 2461yr for pap & physical Mammogram @31yo  or sooner if problems Colonoscopy @33yo  or sooner if problems Keep appt in May w/ Dr. Neal DyParrish McKinney  Daschel Roughton, Cheron EveryKimberly Randall CNM, [facility] 03/30/2013 10:32 AM

## 2013-03-31 LAB — GC/CHLAMYDIA PROBE AMP
CT Probe RNA: NEGATIVE
GC Probe RNA: NEGATIVE

## 2013-03-31 LAB — HIV ANTIBODY (ROUTINE TESTING W REFLEX): HIV: NONREACTIVE

## 2013-03-31 LAB — RPR

## 2013-03-31 LAB — HSV 2 ANTIBODY, IGG: HSV 2 Glycoprotein G Ab, IgG: 0.18 IV

## 2013-03-31 LAB — HEPATITIS C ANTIBODY: HCV Ab: NEGATIVE

## 2013-03-31 LAB — HEPATITIS B SURFACE ANTIGEN: Hepatitis B Surface Ag: NEGATIVE

## 2013-11-08 ENCOUNTER — Encounter: Payer: Self-pay | Admitting: Women's Health

## 2014-04-04 ENCOUNTER — Other Ambulatory Visit: Payer: 59 | Admitting: Women's Health

## 2014-04-18 ENCOUNTER — Encounter: Payer: Self-pay | Admitting: Women's Health

## 2014-04-18 ENCOUNTER — Ambulatory Visit (INDEPENDENT_AMBULATORY_CARE_PROVIDER_SITE_OTHER): Payer: 59 | Admitting: Women's Health

## 2014-04-18 ENCOUNTER — Other Ambulatory Visit (HOSPITAL_COMMUNITY)
Admission: RE | Admit: 2014-04-18 | Discharge: 2014-04-18 | Disposition: A | Discharge status: Home / Self Care | Payer: 59 | Source: Ambulatory Visit | Attending: Obstetrics & Gynecology | Admitting: Obstetrics & Gynecology

## 2014-04-18 VITALS — BP 122/72 | HR 72 | Ht 62.25 in | Wt 140.0 lb

## 2014-04-18 DIAGNOSIS — Z1151 Encounter for screening for human papillomavirus (HPV): Secondary | ICD-10-CM | POA: Diagnosis present

## 2014-04-18 DIAGNOSIS — Z113 Encounter for screening for infections with a predominantly sexual mode of transmission: Secondary | ICD-10-CM | POA: Diagnosis present

## 2014-04-18 DIAGNOSIS — Z01419 Encounter for gynecological examination (general) (routine) without abnormal findings: Secondary | ICD-10-CM

## 2014-04-18 DIAGNOSIS — R102 Pelvic and perineal pain: Secondary | ICD-10-CM

## 2014-04-18 NOTE — Progress Notes (Signed)
Patient ID: Madison Golden, female   DOB: 01/15/1980, 34 y.o.   MRN: [id number]  Subjective:   Madison Golden is a 34 y.o. [phone] Caucasian female here for a routine well-woman exam.  Patient's last menstrual period was 04/05/2014.    Current complaints: regular monthly periods of normal flow but for last few months have been 'lingering' w/ dark brown blood for few more days and occ Lt sided pelvic pain, worse in last 3 days, and worse after sex- not during.  PCP: Dr. Lyanne Coaniel- [facility]       Does desire labs  The following portions of the patient's history were reviewed and updated as appropriate: allergies, current medications, past family history, past medical history, past social history, past surgical history and problem list.  Past Medical History Past Medical History  Diagnosis Date  . Palpitations     Event recorder, 01/2009 NSR, sinus tachy  . Depression   . Sinus tachycardia   . Panic attacks   . Mass of breast, left   . Echocardiogram abnormal 12/2008    EF 55-60%  . Abnormal Pap smear   . Endometriosis   . Seasonal allergies     Past Surgical History Past Surgical History  Procedure Laterality Date  . Cesarean section    . Laparoscopic endometriosis fulguration  2009  . Leep  2012  . Tubal ligation Bilateral 11/10/2012    Procedure: POST PARTUM TUBAL LIGATION;  Surgeon: Willodean Rosenthalarolyn Harraway-Smith, MD;  Location: [facility];  Service: Gynecology;  Laterality: Bilateral;    Gynecologic History [id number]  Patient's last menstrual period was 04/05/2014. Contraception: tubal ligation Last Pap: 2014. Results were: normal- pt states she would feel better getting a pap today Last mammogram: never. Results were: n/a Last TCS: never  Obstetric History OB History  Gravida Para Term Preterm AB SAB TAB Ectopic Multiple Living  3 3 2 1      3     # Outcome Date GA Lbr Len/2nd Weight Sex Delivery Anes PTL Lv  3 Term 11/09/12 3362w4d 04:46 / 00:39 7 lb 14.5 oz (3.586 kg) F Vag-Spont Local   Y     Comments: none  2 Preterm 2004 3950w0d  5 lb 8 oz (2.495 kg) F CS-Unspec Spinal  Y  1 Term 2003 10672w0d  5 lb 13 oz (2.637 kg) F Vag-Spont EPI  Y      Current Medications Current Outpatient Prescriptions on File Prior to Visit  Medication Sig Dispense Refill  . hydrocortisone-pramoxine (PROCTOFOAM HC) rectal foam Place 1 applicator rectally 2 (two) times daily. (Patient not taking: Reported on 04/18/2014) 10 g 0  . ibuprofen (ADVIL,MOTRIN) 600 MG tablet Take 1 tablet (600 mg total) by mouth every 6 (six) hours. (Patient not taking: Reported on 04/18/2014) 30 tablet 0  . LORazepam (ATIVAN) 0.5 MG tablet Take 1 tablet (0.5 mg total) by mouth 2 (two) times daily. (Patient not taking: Reported on 04/18/2014) 60 tablet 1  . Prenatal Vit-Fe Fumarate-FA (PRENATAL MULTIVITAMIN) TABS Take 1 tablet by mouth daily at 12 noon.     No current facility-administered medications on file prior to visit.    Review of Systems Patient denies any headaches, blurred vision, shortness of breath, chest pain, abdominal pain, problems with bowel movements, urination, or intercourse.  Objective:  BP 122/72 mmHg  Pulse 72  Ht 5' 2.25" (1.581 m)  Wt 140 lb (63.504 kg)  BMI 25.41 kg/m2  LMP 04/05/2014 Physical Exam  General:  Well developed, well nourished, no acute  distress. She is alert and oriented x3. Skin:  Warm and dry Neck:  Midline trachea, no thyromegaly or nodules Cardiovascular: Regular rate and rhythm, no murmur heard Lungs:  Effort normal, all lung fields clear to auscultation bilaterally Breasts:  No dominant palpable mass, retraction, or nipple discharge Abdomen:  Soft, no hepatosplenomegaly or masses, slightly tender LLQ Pelvic:  External genitalia is normal in appearance.  The vagina is normal in appearance. The cervix is bulbous, with mild CMT.  Thin prep pap is done w/  HR HPV cotesting. Uterus is felt to be normal size, shape, and contour.  No adnexal masses. + bilateral adnexal tenderness,  L>R Extremities:  No swelling or varicosities noted Psych:  She has a normal mood and affect  Assessment:   Healthy well-woman exam LLQ pelvic pain and longer periods  Plan:  GC/CT from pap F/U 1wk for pelvic u/s, f/u w/ me, and CBC, CMP, TSH Mammogram @ 32yo or sooner if problems Colonoscopy  or sooner if problems  Marge Duncans CNM, [facility] 04/18/2014 2:53 PM

## 2014-04-20 LAB — CYTOLOGY - PAP

## 2014-04-25 ENCOUNTER — Ambulatory Visit (INDEPENDENT_AMBULATORY_CARE_PROVIDER_SITE_OTHER): Payer: 59

## 2014-04-25 ENCOUNTER — Ambulatory Visit: Payer: 59 | Admitting: Women's Health

## 2014-04-25 DIAGNOSIS — R102 Pelvic and perineal pain: Secondary | ICD-10-CM

## 2014-04-25 NOTE — Progress Notes (Signed)
US pelvis,normal anteverted uterus,normal ovs bilat,no free fluid

## 2014-04-27 ENCOUNTER — Other Ambulatory Visit: Payer: Self-pay | Admitting: Women's Health

## 2014-04-27 ENCOUNTER — Encounter: Payer: Self-pay | Admitting: Women's Health

## 2014-04-27 ENCOUNTER — Ambulatory Visit (INDEPENDENT_AMBULATORY_CARE_PROVIDER_SITE_OTHER): Payer: 59 | Admitting: Women's Health

## 2014-04-27 VITALS — BP 110/60 | HR 60 | Ht 62.25 in | Wt 139.0 lb

## 2014-04-27 DIAGNOSIS — Z01419 Encounter for gynecological examination (general) (routine) without abnormal findings: Secondary | ICD-10-CM

## 2014-04-27 DIAGNOSIS — R102 Pelvic and perineal pain: Secondary | ICD-10-CM

## 2014-04-27 NOTE — Progress Notes (Signed)
Patient ID: Madison Golden, female   DOB: 02/22/1980, 34 y.o.   MRN: [id number]   [facility]  Patient name: Madison NestHeather N Golden MRN [id number]  Date of birth: 07/01/1980  CC & HPI:  Madison Golden is a 34 y.o. Caucasian female presenting today to discuss pelvic u/s results from 4/18 d/t pelvic pain and longer periods. Pelvic u/s normal, pap & gc/ct normal from 04/18/14.   Pertinent History Reviewed:  Medical & Surgical Hx:   Past Medical History  Diagnosis Date  . Palpitations     Event recorder, 01/2009 NSR, sinus tachy  . Depression   . Sinus tachycardia   . Panic attacks   . Mass of breast, left   . Echocardiogram abnormal 12/2008    EF 55-60%  . Abnormal Pap smear   . Endometriosis   . Seasonal allergies    Past Surgical History  Procedure Laterality Date  . Cesarean section    . Laparoscopic endometriosis fulguration  2009  . Leep  2012  . Tubal ligation Bilateral 11/10/2012    Procedure: POST PARTUM TUBAL LIGATION;  Surgeon: Willodean Rosenthalarolyn Harraway-Smith, MD;  Location: [facility];  Service: Gynecology;  Laterality: Bilateral;   Medications: Reviewed & Updated - see associated section Social History: Reviewed -  reports that she has never smoked. She has never used smokeless tobacco.  Objective Findings:  Vitals: BP 110/60 mmHg  Pulse 60  Ht 5' 2.25" (1.581 m)  Wt 139 lb (63.05 kg)  BMI 25.22 kg/m2  LMP 04/05/2014  Breastfeeding? No  Physical Examination: General appearance - alert, well appearing, and in no distress  Pelvic u/s from 04/25/14: Madison Golden is a 34 y.o. [id number] LMP 04/05/2014 for a pelvic sonogram for bilat pelvic pain. Uterus 9.24 x 4.08 x 6.73 cm, wnl Endometrium 6.7 mm, symmetrical wnl,  Right ovary 2.93 x 2.47 x 2 cm, wnl Left ovary 2.2 x 2.1 x 1.0 cm, wnl  Technician Comments:US pelvis,normal anteverted uterus,normal ovs bilat,no free fluid  Karie Chimeramber J Carl 04/25/2014 1:55  PM    Assessment & Plan:  A:   Pelvic pain w/ longer periods  Normal pelvic gyn u/s P:  Discussed normal results, and unknown etiology of pain  CBC, CMP, TSH today for annual labs   F/U 2710yr for physical or earlier if pain/sx worsen   Marge DuncansBooker, Daelin Haste Randall CNM, [facility]NP[facility] 04/27/2014 12:22 PM

## 2014-04-28 LAB — CBC
HCT: 40.9 % (ref 34.0–46.6)
Hemoglobin: 13.4 g/dL (ref 11.1–15.9)
MCH: 29.2 pg (ref 26.6–33.0)
MCHC: 32.8 g/dL (ref 31.5–35.7)
MCV: 89 fL (ref 79–97)
Platelets: 218 10*3/uL (ref 150–379)
RBC: 4.59 x10E6/uL (ref 3.77–5.28)
RDW: 13.1 % (ref 12.3–15.4)
WBC: 7.1 10*3/uL (ref 3.4–10.8)

## 2014-04-28 LAB — COMPREHENSIVE METABOLIC PANEL
ALT: 15 IU/L (ref 0–32)
AST: 13 IU/L (ref 0–40)
Albumin/Globulin Ratio: 1.8 (ref 1.1–2.5)
Albumin: 4.6 g/dL (ref 3.5–5.5)
Alkaline Phosphatase: 60 IU/L (ref 39–117)
BUN/Creatinine Ratio: 24 — ABNORMAL HIGH (ref 8–20)
BUN: 18 mg/dL (ref 6–20)
Bilirubin Total: 0.5 mg/dL (ref 0.0–1.2)
CO2: 21 mmol/L (ref 18–29)
Calcium: 9.5 mg/dL (ref 8.7–10.2)
Chloride: 102 mmol/L (ref 97–108)
Creatinine, Ser: 0.75 mg/dL (ref 0.57–1.00)
GFR calc Af Amer: 122 mL/min/{1.73_m2} (ref >59)
GFR calc non Af Amer: 106 mL/min/{1.73_m2} (ref >59)
Globulin, Total: 2.5 g/dL (ref 1.5–4.5)
Glucose: 82 mg/dL (ref 65–99)
Potassium: 4.3 mmol/L (ref 3.5–5.2)
Sodium: 140 mmol/L (ref 134–144)
Total Protein: 7.1 g/dL (ref 6.0–8.5)

## 2014-04-28 LAB — TSH: TSH: 2.22 u[IU]/mL (ref 0.450–4.500)

## 2014-05-03 ENCOUNTER — Telehealth: Payer: Self-pay | Admitting: Women's Health

## 2014-05-03 DIAGNOSIS — R799 Abnormal finding of blood chemistry, unspecified: Secondary | ICD-10-CM

## 2014-05-03 NOTE — Telephone Encounter (Signed)
LM for pt to return call to discuss labs.  Cheral MarkerKimberly R. Danylah Holden, CNM, [facility] 05/03/2014 9:39 AM

## 2014-05-03 NOTE — Telephone Encounter (Signed)
Pt returned call. Notified her of normal labs w/ exception of slightly elevated BUN/Cr ratio of 24. Denies urinary sx/problems, no h/o renal dz. Will recheck in 3-4wks, if still elevated will consult nephrology.  Reports pelvic pain improved.  Cheral MarkerKimberly R. Roxene Alviar, CNM, [facility] 05/03/2014 9:44 AM

## 2014-05-30 ENCOUNTER — Other Ambulatory Visit: Payer: Self-pay | Admitting: *Deleted

## 2014-05-30 ENCOUNTER — Other Ambulatory Visit: Payer: 59

## 2014-05-30 DIAGNOSIS — R799 Abnormal finding of blood chemistry, unspecified: Secondary | ICD-10-CM

## 2014-05-31 LAB — COMPREHENSIVE METABOLIC PANEL
ALT: 15 IU/L (ref 0–32)
AST: 15 IU/L (ref 0–40)
Albumin/Globulin Ratio: 1.7 (ref 1.1–2.5)
Albumin: 4.2 g/dL (ref 3.5–5.5)
Alkaline Phosphatase: 63 IU/L (ref 39–117)
BUN/Creatinine Ratio: 17 (ref 8–20)
BUN: 13 mg/dL (ref 6–20)
Bilirubin Total: 0.3 mg/dL (ref 0.0–1.2)
CO2: 22 mmol/L (ref 18–29)
Calcium: 9.3 mg/dL (ref 8.7–10.2)
Chloride: 103 mmol/L (ref 97–108)
Creatinine, Ser: 0.78 mg/dL (ref 0.57–1.00)
GFR calc Af Amer: 116 mL/min/{1.73_m2} (ref >59)
GFR calc non Af Amer: 101 mL/min/{1.73_m2} (ref >59)
Globulin, Total: 2.5 g/dL (ref 1.5–4.5)
Glucose: 81 mg/dL (ref 65–99)
Potassium: 4.2 mmol/L (ref 3.5–5.2)
Sodium: 139 mmol/L (ref 134–144)
Total Protein: 6.7 g/dL (ref 6.0–8.5)

## 2015-04-24 ENCOUNTER — Other Ambulatory Visit: Payer: 59 | Admitting: Women's Health

## 2015-04-25 ENCOUNTER — Other Ambulatory Visit: Payer: 59 | Admitting: Women's Health

## 2015-05-08 ENCOUNTER — Encounter: Payer: Self-pay | Admitting: Women's Health

## 2015-05-08 ENCOUNTER — Other Ambulatory Visit (HOSPITAL_COMMUNITY)
Admission: RE | Admit: 2015-05-08 | Discharge: 2015-05-08 | Disposition: A | Discharge status: Home / Self Care | Payer: 59 | Source: Ambulatory Visit | Attending: Obstetrics & Gynecology | Admitting: Obstetrics & Gynecology

## 2015-05-08 ENCOUNTER — Ambulatory Visit (INDEPENDENT_AMBULATORY_CARE_PROVIDER_SITE_OTHER): Payer: 59 | Admitting: Women's Health

## 2015-05-08 VITALS — BP 100/72 | HR 60 | Ht 62.5 in | Wt 136.0 lb

## 2015-05-08 DIAGNOSIS — Z1151 Encounter for screening for human papillomavirus (HPV): Secondary | ICD-10-CM | POA: Insufficient documentation

## 2015-05-08 DIAGNOSIS — Z01419 Encounter for gynecological examination (general) (routine) without abnormal findings: Secondary | ICD-10-CM | POA: Diagnosis not present

## 2015-05-08 DIAGNOSIS — N6320 Unspecified lump in the left breast, unspecified quadrant: Secondary | ICD-10-CM | POA: Insufficient documentation

## 2015-05-08 NOTE — Patient Instructions (Signed)
Breast [facility], be there at 9:45am. No lotion/deoderant/powder

## 2015-05-08 NOTE — Progress Notes (Signed)
Patient ID: Madison Golden, female   DOB: 07/05/1980, 35 y.o.   MRN: [id number] Subjective:   Madison NestHeather N Golden is a 35 y.o. [phone] Caucasian female here for a routine well-woman exam.  Patient's last menstrual period was 04/25/2015.    Current complaints: Lt breast hard in one spot for few months, no family h/o breast CA. Periods regular q mth since BTL, but old blood lingers for ~wk or so after period PCP: Dr. Murray Hodgkinsaniels [facility]       Does not desire labs, just doen last week w/ PCP and normal  Social History: Sexual: heterosexual Marital Status: married Living situation: with family Occupation: RT at [employer] Tobacco/alcohol: no tobacco, etoh: none Illicit drugs: no history of illicit drug use  The following portions of the patient's history were reviewed and updated as appropriate: allergies, current medications, past family history, past medical history, past social history, past surgical history and problem list.  Past Medical History Past Medical History  Diagnosis Date  . Palpitations     Event recorder, 01/2009 NSR, sinus tachy  . Depression   . Sinus tachycardia (HCC)   . Panic attacks   . Mass of breast, left   . Echocardiogram abnormal 12/2008    EF 55-60%  . Abnormal Pap smear   . Endometriosis   . Seasonal allergies     Past Surgical History Past Surgical History  Procedure Laterality Date  . Cesarean section    . Laparoscopic endometriosis fulguration  2009  . Leep  2012  . Tubal ligation Bilateral 11/10/2012    Procedure: POST PARTUM TUBAL LIGATION;  Surgeon: Willodean Rosenthalarolyn Harraway-Smith, MD;  Location: [facility];  Service: Gynecology;  Laterality: Bilateral;    Gynecologic History [id number]  Patient's last menstrual period was 04/25/2015. Contraception: tubal ligation Last Pap: 04/18/14. Results were: neg w/ -HRHPV, h/o LEEP, requests pap this year Last mammogram: never. Results were: n/a Last TCS: never  Obstetric History OB History  Gravida Para Term Preterm AB SAB TAB  Ectopic Multiple Living  3 3 2 1      3     # Outcome Date GA Lbr Len/2nd Weight Sex Delivery Anes PTL Lv  3 Term 11/09/12 4584w4d 04:46 / 00:39 7 lb 14.5 oz (3.586 kg) F Vag-Spont Local  Y     Comments: none  2 Preterm 2004 9270w0d  5 lb 8 oz (2.495 kg) F CS-Unspec Spinal  Y  1 Term 2003 7191w0d  5 lb 13 oz (2.637 kg) F Vag-Spont EPI  Y      Current Medications No current outpatient prescriptions on file prior to visit.   No current facility-administered medications on file prior to visit.    Review of Systems Patient denies any headaches, blurred vision, shortness of breath, chest pain, abdominal pain, problems with bowel movements, urination, or intercourse.  Objective:  BP 100/72 mmHg  Pulse 60  Ht 5' 2.5" (1.588 m)  Wt 136 lb (61.689 kg)  BMI 24.46 kg/m2  LMP 04/25/2015 Physical Exam  General:  Well developed, well nourished, no acute distress. She is alert and oriented x3. Skin:  Warm and dry Neck:  Midline trachea, no thyromegaly or nodules Cardiovascular: Regular rate and rhythm, no murmur heard Lungs:  Effort normal, all lung fields clear to auscultation bilaterally Breasts:  Rt: No dominant palpable mass, retraction, or nipple discharge Lt: firm non-mobile non-tender mass 9o'clock 375fb from nipple, ~4cmx2cm Abdomen:  Soft, non tender, no hepatosplenomegaly or masses Pelvic:  External genitalia is normal in appearance.  The vagina is normal in appearance. The cervix is bulbous, no CMT.  Thin prep pap is done w/ reflex HR HPV cotesting. Uterus is felt to be normal size, shape, and contour.  No adnexal masses or tenderness noted. Extremities:  No swelling or varicosities noted Psych:  She has a normal mood and affect  Assessment:   Healthy well-woman exam Lt breast mass  Plan:  Bilateral breast u/s and mammo next tues 5/9 @ 1000 at [facility], be there at 9:45am, no lotion/deoderant/powder F/U 61yr for physical, or sooner if needed Mammogram as above  Colonoscopy  or  sooner if problems  Marge Duncans CNM, [facility] 05/08/2015 11:44 AM

## 2015-05-10 LAB — CYTOLOGY - PAP

## 2015-05-12 ENCOUNTER — Ambulatory Visit (INDEPENDENT_AMBULATORY_CARE_PROVIDER_SITE_OTHER): Payer: 59 | Admitting: Cardiology

## 2015-05-12 ENCOUNTER — Encounter: Payer: Self-pay | Admitting: Cardiology

## 2015-05-12 VITALS — BP 124/82 | HR 62 | Ht 62.0 in | Wt 136.0 lb

## 2015-05-12 DIAGNOSIS — R002 Palpitations: Secondary | ICD-10-CM

## 2015-05-12 NOTE — Progress Notes (Signed)
Patient ID: Doreene NestHeather N Janish, female   DOB: 12/14/1980, 35 y.o.   MRN: [id number]     Clinical Summary Ms. Renaldo Fiddlerdkins is a 35 y.o.female seen today as a new patient, she is referred by Dr Donzetta Sprungerry Daniel for the following medical problems.   1. Palpitations - previously seen in 2011 for palpitations by Dr Myrtis SerKatz. Notes also [place] [facility] [place] with a negative 24 hr holter at that time.  - 21 day event monitor in 2011 without significant arrhythmias - echo according to notes in the past showed no structural heart disease   -current  symptoms started around January. Woke up 3 AM, checked and HR 150s. Lasted 5-10 minutes, then resolved. No other related symptoms. Since that time 3-4 episodes. Only occurs when getting out of bed. Last episode 2-3 weeks ago. Episode lasted about 1 hour. - no significant coffee, no tea, no energy, no EtOH. No supplements.  - reports history of anxiety as well.  - exercises regularly, elliptical 30-45 minutes. - can often happen on stressful days. Reports significant stress recently.   Past Medical History  Diagnosis Date  . Palpitations     Event recorder, 01/2009 NSR, sinus tachy  . Depression   . Sinus tachycardia (HCC)   . Panic attacks   . Mass of breast, left   . Echocardiogram abnormal 12/2008    EF 55-60%  . Abnormal Pap smear   . Endometriosis   . Seasonal allergies      No Known Allergies   Current Outpatient Prescriptions  Medication Sig Dispense Refill  . ALPRAZolam (XANAX) 0.5 MG tablet Take 0.25 mg by mouth at bedtime as needed for anxiety.     No current facility-administered medications for this visit.     Past Surgical History  Procedure Laterality Date  . Cesarean section    . Laparoscopic endometriosis fulguration  2009  . Leep  2012  . Tubal ligation Bilateral 11/10/2012    Procedure: POST PARTUM TUBAL LIGATION;  Surgeon: Willodean Rosenthalarolyn Harraway-Smith, MD;  Location: [facility];  Service: Gynecology;   Laterality: Bilateral;     No Known Allergies    Family History  Problem Relation Age of Onset  . Coronary artery disease Neg Hx   . Cancer Maternal Grandmother     lung  . Diabetes Maternal Grandmother   . Hypertension Maternal Grandmother   . Stroke Paternal Grandmother   . Cirrhosis Maternal Grandfather      Social History Ms. Renaldo Fiddlerdkins reports that she has never smoked. She has never used smokeless tobacco. Ms. Renaldo Fiddlerdkins reports that she does not drink alcohol.   Review of Systems CONSTITUTIONAL: No weight loss, fever, chills, weakness or fatigue.  HEENT: Eyes: No visual loss, blurred vision, double vision or yellow sclerae.No hearing loss, sneezing, congestion, runny nose or sore throat.  SKIN: No rash or itching.  CARDIOVASCULAR: per HPI RESPIRATORY: No shortness of breath, cough or sputum.  GASTROINTESTINAL: No anorexia, nausea, vomiting or diarrhea. No abdominal pain or blood.  GENITOURINARY: No burning on urination, no polyuria NEUROLOGICAL: No headache, dizziness, syncope, paralysis, ataxia, numbness or tingling in the extremities. No change in bowel or bladder control.  MUSCULOSKELETAL: No muscle, back pain, joint pain or stiffness.  LYMPHATICS: No enlarged nodes. No history of splenectomy.  PSYCHIATRIC: No history of depression or anxiety.  ENDOCRINOLOGIC: No reports of sweating, cold or heat intolerance. No polyuria or polydipsia.  Marland Kitchen.   Physical Examination Filed Vitals:   05/12/15 1024  BP: 124/82  Pulse: 62   Filed Weights   05/12/15 1024  Weight: 136 lb (61.689 kg)    Gen: resting comfortably, no acute distress HEENT: no scleral icterus, pupils equal round and reactive, no palptable cervical adenopathy,  CV: RRR, no m/r/g no jvd Resp: Clear to auscultation bilaterally GI: abdomen is soft, non-tender, non-distended, normal bowel sounds, no hepatosplenomegaly MSK: extremities are warm, no edema.  Skin: warm, no rash Neuro:  no focal deficits Psych:  appropriate affect     Assessment and Plan  1. Palpitations - likely benign ectopy, at most PSVT. She would like to avoid medication if possible - recent EKGs reviewed, show normal sinus rhythm - will obtain 21 day event monitor to further evaluate and help guide treatment.     F/u 6 weeks  Antoine Poche, M.D.

## 2015-05-12 NOTE — Patient Instructions (Signed)
Your physician recommends that you schedule a follow-up appointment in: 6 weeks with Dr. Wyline MoodBranch  Your physician recommends that you continue on your current medications as directed. Please refer to the Current Medication list given to you today.  Your physician has recommended that you wear an event monitor. Event monitors are medical devices that record the heart's electrical activity. Doctors most often us these monitors to diagnose arrhythmias. Arrhythmias are problems with the speed or rhythm of the heartbeat. The monitor is a small, portable device. You can wear one while you do your normal daily activities. This is usually used to diagnose what is causing palpitations/syncope (passing out).  Thank you for choosing [facility]!!

## 2015-05-16 ENCOUNTER — Encounter (HOSPITAL_COMMUNITY): Payer: Commercial Managed Care - PPO

## 2015-05-23 ENCOUNTER — Ambulatory Visit (HOSPITAL_COMMUNITY)
Admission: RE | Admit: 2015-05-23 | Discharge: 2015-05-23 | Disposition: A | Discharge status: Home / Self Care | Payer: 59 | Source: Ambulatory Visit | Attending: Women's Health | Admitting: Women's Health

## 2015-05-23 DIAGNOSIS — N6320 Unspecified lump in the left breast, unspecified quadrant: Secondary | ICD-10-CM

## 2015-05-23 DIAGNOSIS — N63 Unspecified lump in breast: Secondary | ICD-10-CM | POA: Insufficient documentation

## 2015-05-30 ENCOUNTER — Telehealth: Payer: Self-pay

## 2015-05-30 NOTE — Telephone Encounter (Signed)
Pt wants to know if we have record of childhood immunizations; do not see under media tab; pt said new physicians office had checked [facility] already; advised pt to ck with high school or college she graduated from for copy of immunization record; pt voiced understanding.

## 2015-06-04 DIAGNOSIS — R002 Palpitations: Secondary | ICD-10-CM | POA: Diagnosis not present

## 2015-06-06 ENCOUNTER — Ambulatory Visit (INDEPENDENT_AMBULATORY_CARE_PROVIDER_SITE_OTHER): Payer: 59

## 2015-06-07 ENCOUNTER — Telehealth: Payer: Self-pay | Admitting: *Deleted

## 2015-06-07 MED ORDER — METOPROLOL TARTRATE 25 MG PO TABS: 25.0000 mg | ORAL_TABLET | Freq: Two times a day (BID) | ORAL | Status: DC

## 2015-06-07 NOTE — Telephone Encounter (Signed)
-----   Message from Antoine PocheJonathan F Branch, MD sent at 06/07/2015 10:14 AM EDT ----- Heart monitor looks good. There are no signficant abnormal rhythms detected. There is nothing worrisome seen in her heart monitor. How are her symptoms doing? If they are still bothering her enough we can try medication. If they are not bothering her can continue to monitor at this time, continue to limit caffeine and alcohol  Madison FerryJ Branch MD

## 2015-06-07 NOTE — Telephone Encounter (Signed)
Pt aware and will update in 1 week. [facility]

## 2015-06-07 NOTE — Telephone Encounter (Signed)
Pt aware and says she is still having symptoms daily, not any worse, not any better. Agreeable to try medicine.

## 2015-06-07 NOTE — Telephone Encounter (Signed)
Have her start lopressor 12.5mg  bid, and update us symptoms in about a week.   Dominga FerryJ Tearsa Kowalewski MD

## 2015-06-23 ENCOUNTER — Encounter: Payer: Self-pay | Admitting: Cardiology

## 2015-06-23 ENCOUNTER — Ambulatory Visit: Payer: 59 | Admitting: Cardiology

## 2015-06-23 NOTE — Progress Notes (Unsigned)
Clinical Summary Ms. Sporer is a 35 y.o.female  1. Palpitations - previously seen in 2011 for palpitations by Dr Myrtis Ser. Notes also indicate a previous workup in [facility] around 2005 with a negative 24 hr holter at that time.  - 21 day event monitor in 2011 without significant arrhythmias - echo according to notes in the past showed no structural heart disease   -current symptoms started around January. Woke up 3 AM, checked and HR 150s. Lasted 5-10 minutes, then resolved. No other related symptoms. Since that time 3-4 episodes. Only occurs when getting out of bed. Last episode 2-3 weeks ago. Episode lasted about 1 hour. - no significant coffee, no tea, no energy, no EtOH. No supplements.  - reports history of anxiety as well.  - exercises regularly, elliptical 30-45 minutes. - can often happen on stressful days. Reports significant stress recently.   - since last visit she completed a heart monitor that showed no significant arrhythmias.  - started on lopressor 12.5mg  bid Past Medical History  Diagnosis Date  . Palpitations     Event recorder, 01/2009 NSR, sinus tachy  . Depression   . Sinus tachycardia (HCC)   . Panic attacks   . Mass of breast, left   . Echocardiogram abnormal 12/2008    EF 55-60%  . Abnormal Pap smear   . Endometriosis   . Seasonal allergies      No Known Allergies   Current Outpatient Prescriptions  Medication Sig Dispense Refill  . ALPRAZolam (XANAX) 0.5 MG tablet Take 0.25 mg by mouth at bedtime as needed for anxiety.    . metoprolol tartrate (LOPRESSOR) 25 MG tablet Take 1 tablet (25 mg total) by mouth 2 (two) times daily. 60 tablet 3   No current facility-administered medications for this visit.     Past Surgical History  Procedure Laterality Date  . Cesarean section    . Laparoscopic endometriosis fulguration  2009  . Leep  2012  . Tubal ligation Bilateral 11/10/2012    Procedure: POST PARTUM TUBAL LIGATION;  Surgeon: Willodean Rosenthal, MD;  Location: [facility];  Service: Gynecology;  Laterality: Bilateral;     No Known Allergies    Family History  Problem Relation Age of Onset  . Coronary artery disease Neg Hx   . Cancer Maternal Grandmother     lung  . Diabetes Maternal Grandmother   . Hypertension Maternal Grandmother   . Stroke Paternal Grandmother   . Cirrhosis Maternal Grandfather      Social History Ms. Achorn reports that she has never smoked. She has never used smokeless tobacco. Ms. Heldt reports that she does not drink alcohol.   Review of Systems CONSTITUTIONAL: No weight loss, fever, chills, weakness or fatigue.  HEENT: Eyes: No visual loss, blurred vision, double vision or yellow sclerae.No hearing loss, sneezing, congestion, runny nose or sore throat.  SKIN: No rash or itching.  CARDIOVASCULAR:  RESPIRATORY: No shortness of breath, cough or sputum.  GASTROINTESTINAL: No anorexia, nausea, vomiting or diarrhea. No abdominal pain or blood.  GENITOURINARY: No burning on urination, no polyuria NEUROLOGICAL: No headache, dizziness, syncope, paralysis, ataxia, numbness or tingling in the extremities. No change in bowel or bladder control.  MUSCULOSKELETAL: No muscle, back pain, joint pain or stiffness.  LYMPHATICS: No enlarged nodes. No history of splenectomy.  PSYCHIATRIC: No history of depression or anxiety.  ENDOCRINOLOGIC: No reports of sweating, cold or heat intolerance. No polyuria or polydipsia.  Marland Kitchen   Physical Examination There  were no vitals filed for this visit. There were no vitals filed for this visit.  Gen: resting comfortably, no acute distress HEENT: no scleral icterus, pupils equal round and reactive, no palptable cervical adenopathy,  CV Resp: Clear to auscultation bilaterally GI: abdomen is soft, non-tender, non-distended, normal bowel sounds, no hepatosplenomegaly MSK: extremities are warm, no edema.  Skin: warm, no rash Neuro:  no focal deficits Psych:  appropriate affect   Diagnostic Studies  05/2015 21 day event monitor  Telemetry strips show sinus rhythm  Reported symptoms correlate with sinus rhythm and sinus tachycardia  No significant arrhythmias   Assessment and Plan  1. Palpitations - likely benign ectopy, at most PSVT. She would like to avoid medication if possible - recent EKGs reviewed, show normal sinus rhythm - will obtain 21 day event monitor to further evaluate and help guide treatment.       Antoine PocheJonathan F. Rolly Magri, M.D., F.A.C.C.

## 2015-12-08 ENCOUNTER — Telehealth: Payer: 59 | Admitting: Family

## 2015-12-08 DIAGNOSIS — J329 Chronic sinusitis, unspecified: Secondary | ICD-10-CM

## 2015-12-08 DIAGNOSIS — B9689 Other specified bacterial agents as the cause of diseases classified elsewhere: Secondary | ICD-10-CM

## 2015-12-08 MED ORDER — AMOXICILLIN-POT CLAVULANATE 875-125 MG PO TABS: 1.0000 | ORAL_TABLET | Freq: Two times a day (BID) | ORAL | 0 refills | Status: AC

## 2015-12-08 NOTE — Progress Notes (Signed)

## 2016-01-10 DIAGNOSIS — M9903 Segmental and somatic dysfunction of lumbar region: Secondary | ICD-10-CM | POA: Diagnosis not present

## 2016-01-10 DIAGNOSIS — M5136 Other intervertebral disc degeneration, lumbar region: Secondary | ICD-10-CM | POA: Diagnosis not present

## 2016-01-15 DIAGNOSIS — M9903 Segmental and somatic dysfunction of lumbar region: Secondary | ICD-10-CM | POA: Diagnosis not present

## 2016-01-15 DIAGNOSIS — M5136 Other intervertebral disc degeneration, lumbar region: Secondary | ICD-10-CM | POA: Diagnosis not present

## 2016-01-29 DIAGNOSIS — M9903 Segmental and somatic dysfunction of lumbar region: Secondary | ICD-10-CM | POA: Diagnosis not present

## 2016-01-29 DIAGNOSIS — M5136 Other intervertebral disc degeneration, lumbar region: Secondary | ICD-10-CM | POA: Diagnosis not present

## 2016-01-30 DIAGNOSIS — R000 Tachycardia, unspecified: Secondary | ICD-10-CM | POA: Diagnosis not present

## 2016-01-31 DIAGNOSIS — M9903 Segmental and somatic dysfunction of lumbar region: Secondary | ICD-10-CM | POA: Diagnosis not present

## 2016-01-31 DIAGNOSIS — M5136 Other intervertebral disc degeneration, lumbar region: Secondary | ICD-10-CM | POA: Diagnosis not present

## 2016-02-12 DIAGNOSIS — M5136 Other intervertebral disc degeneration, lumbar region: Secondary | ICD-10-CM | POA: Diagnosis not present

## 2016-02-12 DIAGNOSIS — M9903 Segmental and somatic dysfunction of lumbar region: Secondary | ICD-10-CM | POA: Diagnosis not present

## 2016-02-19 DIAGNOSIS — M5136 Other intervertebral disc degeneration, lumbar region: Secondary | ICD-10-CM | POA: Diagnosis not present

## 2016-02-19 DIAGNOSIS — M9903 Segmental and somatic dysfunction of lumbar region: Secondary | ICD-10-CM | POA: Diagnosis not present

## 2016-03-04 DIAGNOSIS — M9903 Segmental and somatic dysfunction of lumbar region: Secondary | ICD-10-CM | POA: Diagnosis not present

## 2016-03-04 DIAGNOSIS — M5136 Other intervertebral disc degeneration, lumbar region: Secondary | ICD-10-CM | POA: Diagnosis not present

## 2016-03-11 DIAGNOSIS — M5136 Other intervertebral disc degeneration, lumbar region: Secondary | ICD-10-CM | POA: Diagnosis not present

## 2016-03-11 DIAGNOSIS — M9903 Segmental and somatic dysfunction of lumbar region: Secondary | ICD-10-CM | POA: Diagnosis not present

## 2016-03-20 DIAGNOSIS — M9903 Segmental and somatic dysfunction of lumbar region: Secondary | ICD-10-CM | POA: Diagnosis not present

## 2016-03-20 DIAGNOSIS — M5136 Other intervertebral disc degeneration, lumbar region: Secondary | ICD-10-CM | POA: Diagnosis not present

## 2016-03-28 DIAGNOSIS — M9903 Segmental and somatic dysfunction of lumbar region: Secondary | ICD-10-CM | POA: Diagnosis not present

## 2016-03-28 DIAGNOSIS — M5136 Other intervertebral disc degeneration, lumbar region: Secondary | ICD-10-CM | POA: Diagnosis not present

## 2016-04-03 DIAGNOSIS — M9903 Segmental and somatic dysfunction of lumbar region: Secondary | ICD-10-CM | POA: Diagnosis not present

## 2016-04-03 DIAGNOSIS — M5136 Other intervertebral disc degeneration, lumbar region: Secondary | ICD-10-CM | POA: Diagnosis not present

## 2016-04-08 DIAGNOSIS — M5136 Other intervertebral disc degeneration, lumbar region: Secondary | ICD-10-CM | POA: Diagnosis not present

## 2016-04-08 DIAGNOSIS — M9903 Segmental and somatic dysfunction of lumbar region: Secondary | ICD-10-CM | POA: Diagnosis not present

## 2016-04-15 DIAGNOSIS — M9903 Segmental and somatic dysfunction of lumbar region: Secondary | ICD-10-CM | POA: Diagnosis not present

## 2016-04-15 DIAGNOSIS — M5136 Other intervertebral disc degeneration, lumbar region: Secondary | ICD-10-CM | POA: Diagnosis not present

## 2016-04-22 DIAGNOSIS — M5136 Other intervertebral disc degeneration, lumbar region: Secondary | ICD-10-CM | POA: Diagnosis not present

## 2016-04-22 DIAGNOSIS — M9903 Segmental and somatic dysfunction of lumbar region: Secondary | ICD-10-CM | POA: Diagnosis not present

## 2016-04-23 DIAGNOSIS — R000 Tachycardia, unspecified: Secondary | ICD-10-CM | POA: Diagnosis not present

## 2016-04-29 DIAGNOSIS — M9903 Segmental and somatic dysfunction of lumbar region: Secondary | ICD-10-CM | POA: Diagnosis not present

## 2016-04-29 DIAGNOSIS — M5136 Other intervertebral disc degeneration, lumbar region: Secondary | ICD-10-CM | POA: Diagnosis not present

## 2016-05-06 DIAGNOSIS — M5136 Other intervertebral disc degeneration, lumbar region: Secondary | ICD-10-CM | POA: Diagnosis not present

## 2016-05-06 DIAGNOSIS — M9903 Segmental and somatic dysfunction of lumbar region: Secondary | ICD-10-CM | POA: Diagnosis not present

## 2016-05-13 ENCOUNTER — Encounter: Payer: Self-pay | Admitting: Women's Health

## 2016-05-13 ENCOUNTER — Other Ambulatory Visit (HOSPITAL_COMMUNITY)
Admission: RE | Admit: 2016-05-13 | Discharge: 2016-05-13 | Disposition: A | Discharge status: Home / Self Care | Payer: 59 | Source: Ambulatory Visit | Attending: Obstetrics & Gynecology | Admitting: Obstetrics & Gynecology

## 2016-05-13 ENCOUNTER — Ambulatory Visit (INDEPENDENT_AMBULATORY_CARE_PROVIDER_SITE_OTHER): Payer: 59 | Admitting: Women's Health

## 2016-05-13 VITALS — BP 106/66 | HR 75 | Ht 63.0 in | Wt 143.0 lb

## 2016-05-13 DIAGNOSIS — M9903 Segmental and somatic dysfunction of lumbar region: Secondary | ICD-10-CM | POA: Diagnosis not present

## 2016-05-13 DIAGNOSIS — Z01419 Encounter for gynecological examination (general) (routine) without abnormal findings: Secondary | ICD-10-CM | POA: Diagnosis not present

## 2016-05-13 DIAGNOSIS — M5136 Other intervertebral disc degeneration, lumbar region: Secondary | ICD-10-CM | POA: Diagnosis not present

## 2016-05-13 DIAGNOSIS — N6320 Unspecified lump in the left breast, unspecified quadrant: Secondary | ICD-10-CM

## 2016-05-13 NOTE — Progress Notes (Signed)
Subjective:   Madison Golden is a 36 y.o. [phone] Caucasian female here for a routine well-woman exam.  Patient's last menstrual period was 04/26/2016 (exact date).   Had mammo/breast u/s 05/23/15 for palpable abnormality Lt upper inner breast- birads 3/probably benign, never went for 6mth f/u d/t school schedule. States she will be out in 6wks and will schedule for then. She reports no changes in this area.  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  Past Medical History Past Medical History:  Diagnosis Date  . Abnormal Pap smear   . Depression   . Echocardiogram abnormal 12/2008   EF 55-60%  . Endometriosis   . Mass of breast, left   . Palpitations    Event recorder, 01/2009 NSR, sinus tachy  . Panic attacks   . Seasonal allergies   . Sinus tachycardia     Past Surgical History Past Surgical History:  Procedure Laterality Date  . CESAREAN SECTION    . LAPAROSCOPIC ENDOMETRIOSIS FULGURATION  2009  . LEEP  2012  . TUBAL LIGATION Bilateral 11/10/2012   Procedure: POST PARTUM TUBAL LIGATION;  Surgeon: Willodean Rosenthalarolyn Harraway-Smith, MD;  Location: [facility];  Service: Gynecology;  Laterality: Bilateral;    Gynecologic History [id number]  Patient's last menstrual period was 04/26/2016 (exact date). Contraception: tubal ligation Last Pap: 05/08/15. Results were: normal. H/O abnormal pap w/ LEEP, prefers yearly paps Last  mammogram: 05/2015. Results were: birads 3/probably benign, recommended f/u 6mths Last TCS: never  Obstetric History OB History  Gravida Para Term Preterm AB Living  3 3 2 1   3   SAB TAB Ectopic Multiple Live Births          3    # Outcome Date GA Lbr Len/2nd Weight Sex Delivery Anes PTL Lv  3 Term 11/09/12 8225w4d 04:46 / 00:39 7 lb 14.5 oz (3.586 kg) F Vag-Spont Local  LIV     Birth Comments: none  2 Preterm 2004 7050w0d  5 lb 8 oz (2.495 kg) F CS-Unspec Spinal  LIV  1 Term 2003 77102w0d  5 lb 13 oz (2.637 kg) F Vag-Spont EPI  LIV      Current Medications Current Outpatient Prescriptions on File Prior to Visit  Medication Sig Dispense Refill  . ALPRAZolam (XANAX) 0.5 MG tablet Take 0.25 mg by mouth at bedtime as needed for anxiety.    . metoprolol tartrate (LOPRESSOR) 25 MG tablet Take 1 tablet (25 mg total) by mouth 2 (two) times daily. (Patient not taking: Reported on 05/13/2016) 60 tablet 3   No current facility-administered medications on file prior to visit.     Review of Systems Patient denies any headaches, blurred vision, shortness of breath, chest pain, abdominal pain, problems with bowel movements, urination, or intercourse.  Objective:  BP 106/66 (BP Location: Right Arm, Patient Position: Sitting, Cuff Size: Normal)   Pulse 75   Ht 5\' 3"  (1.6 m)   Wt 143 lb (64.9 kg)   LMP  04/26/2016 (Exact Date)   BMI 25.33 kg/m  Physical Exam  General:  Well developed, well nourished, no acute distress. She is alert and oriented x3. Skin:  Warm and dry Neck:  Midline trachea, no thyromegaly or nodules Cardiovascular: Regular rate and rhythm, no murmur heard Lungs:  Effort normal, all lung fields clear to auscultation bilaterally Breasts:  Rt: No dominant palpable mass, retraction, or nipple discharge Lt: firm ridge Lt upper inner breast, as noted in past, no obvious changes from last exam Abdomen:  Soft, non tender, no hepatosplenomegaly or masses Pelvic:  External genitalia is  normal in appearance.  The vagina is normal in appearance. The cervix is bulbous, no CMT.  Thin prep pap is done w/ HR HPV cotesting. Uterus is felt to be normal size, shape, and contour.  No adnexal masses or tenderness noted. Extremities:  No swelling or varicosities noted Psych:  She has a normal mood and affect  Assessment:   Healthy well-woman exam h/o abnormal pap Known Lt breast abnormality, post due for f/u Irregular bleeding s/p BTL  Plan:  To schedule f/u mammo/us for in 6wks when done w/ school, pt states unable to do until then F/U 59yr for pap & physical, or sooner if needed Mammogram-as above Colonoscopy @36yo  or sooner if problems  Marge Duncans CNM, [facility] 05/13/2016 1:56 PM

## 2016-05-13 NOTE — Patient Instructions (Signed)
Call to schedule follow up ultrasound/mammogram for when you are done with school

## 2016-05-13 NOTE — Addendum Note (Signed)
Addended by: Tish FredericksonLANCASTER, Shali Vesey A on: 05/13/2016 02:22 PM   Modules accepted: Orders

## 2016-05-16 LAB — CYTOLOGY - PAP
Diagnosis: NEGATIVE
HPV: NOT DETECTED

## 2016-05-27 DIAGNOSIS — M5136 Other intervertebral disc degeneration, lumbar region: Secondary | ICD-10-CM | POA: Diagnosis not present

## 2016-05-27 DIAGNOSIS — M9903 Segmental and somatic dysfunction of lumbar region: Secondary | ICD-10-CM | POA: Diagnosis not present

## 2016-06-05 DIAGNOSIS — M9903 Segmental and somatic dysfunction of lumbar region: Secondary | ICD-10-CM | POA: Diagnosis not present

## 2016-06-05 DIAGNOSIS — M5136 Other intervertebral disc degeneration, lumbar region: Secondary | ICD-10-CM | POA: Diagnosis not present

## 2016-06-10 DIAGNOSIS — M5136 Other intervertebral disc degeneration, lumbar region: Secondary | ICD-10-CM | POA: Diagnosis not present

## 2016-06-10 DIAGNOSIS — M9903 Segmental and somatic dysfunction of lumbar region: Secondary | ICD-10-CM | POA: Diagnosis not present

## 2016-06-17 DIAGNOSIS — M9903 Segmental and somatic dysfunction of lumbar region: Secondary | ICD-10-CM | POA: Diagnosis not present

## 2016-06-17 DIAGNOSIS — M5136 Other intervertebral disc degeneration, lumbar region: Secondary | ICD-10-CM | POA: Diagnosis not present

## 2016-06-24 DIAGNOSIS — M9903 Segmental and somatic dysfunction of lumbar region: Secondary | ICD-10-CM | POA: Diagnosis not present

## 2016-06-24 DIAGNOSIS — M5136 Other intervertebral disc degeneration, lumbar region: Secondary | ICD-10-CM | POA: Diagnosis not present

## 2016-07-22 DIAGNOSIS — M5136 Other intervertebral disc degeneration, lumbar region: Secondary | ICD-10-CM | POA: Diagnosis not present

## 2016-07-22 DIAGNOSIS — M9903 Segmental and somatic dysfunction of lumbar region: Secondary | ICD-10-CM | POA: Diagnosis not present

## 2016-08-08 ENCOUNTER — Telehealth: Payer: 59 | Admitting: Nurse Practitioner

## 2016-08-08 DIAGNOSIS — J309 Allergic rhinitis, unspecified: Secondary | ICD-10-CM

## 2016-08-08 MED ORDER — BENZONATATE 100 MG PO CAPS: 100.0000 mg | ORAL_CAPSULE | Freq: Three times a day (TID) | ORAL | 0 refills | Status: AC | PRN

## 2016-08-08 MED ORDER — FLUTICASONE PROPIONATE 50 MCG/ACT NA SUSP: 2.0000 | Freq: Every day | NASAL | 0 refills | Status: DC

## 2016-08-08 NOTE — Progress Notes (Signed)
We are sorry that you are not feeling well.  Here is how we plan to help!  Based on what you have shared with me it looks like you have sinusitis.  Sinusitis is inflammation and infection in the sinus cavities of the head.  Based on your presentation I believe you most likely have Acute Viral Sinusitis.This is an infection most likely caused by a virus. There is not specific treatment for viral sinusitis other than to help you with the symptoms until the infection runs its course.  You may use an oral decongestant such as Mucinex D or if you have glaucoma or high blood pressure use plain Mucinex. Saline nasal spray help and can safely be used as often as needed for congestion, I have prescribed: Fluticasone nasal spray two sprays in each nostril twice a day.  For you headache pain, you can take liquid Tylenol or Ibuprofen as directed.   Some authorities believe that zinc sprays or the use of Echinacea may shorten the course of your symptoms.  Sinus infections are not as easily transmitted as other respiratory infection, however we still recommend that you avoid close contact with loved ones, especially the very young and elderly.  Remember to wash your hands thoroughly throughout the day as this is the number one way to prevent the spread of infection!  Home Care:  Only take medications as instructed by your medical team.  Complete the entire course of an antibiotic.  Do not take these medications with alcohol.  A steam or ultrasonic humidifier can help congestion.  You can place a towel over your head and breathe in the steam from hot water coming from a faucet.  Avoid close contacts especially the very young and the elderly.  Cover your mouth when you cough or sneeze.  Always remember to wash your hands.  Get Help Right Away If:  You develop worsening fever or sinus pain.  You develop a severe head ache or visual changes.  Your symptoms persist after you have completed your treatment  plan.  Make sure you  Understand these instructions.  Will watch your condition.  Will get help right away if you are not doing well or get worse.  Your e-visit answers were reviewed by a board certified advanced clinical practitioner to complete your personal care plan.  Depending on the condition, your plan could have included both over the counter or prescription medications.  If there is a problem please reply  once you have received a response from your provider.  Your safety is important to us.  If you have drug allergies check your prescription carefully.    You can use MyChart to ask questions about today's visit, request a non-urgent call back, or ask for a work or school excuse for 24 hours related to this e-Visit. If it has been greater than 24 hours you will need to follow up with your provider, or enter a new e-Visit to address those concerns.  You will get an e-mail in the next two days asking about your experience.  I hope that your e-visit has been valuable and will speed your recovery. Thank you for using e-visits.

## 2016-08-08 NOTE — Addendum Note (Signed)
Addended by: Jackquline BerlinLEATH, Taim Wurm J on: 08/08/2016 09:12 AM   Modules accepted: Orders

## 2016-08-14 DIAGNOSIS — R000 Tachycardia, unspecified: Secondary | ICD-10-CM | POA: Diagnosis not present

## 2016-08-14 DIAGNOSIS — J209 Acute bronchitis, unspecified: Secondary | ICD-10-CM | POA: Diagnosis not present

## 2016-12-05 DIAGNOSIS — G43909 Migraine, unspecified, not intractable, without status migrainosus: Secondary | ICD-10-CM | POA: Diagnosis not present

## 2016-12-05 DIAGNOSIS — R000 Tachycardia, unspecified: Secondary | ICD-10-CM | POA: Diagnosis not present

## 2017-01-09 DIAGNOSIS — M9903 Segmental and somatic dysfunction of lumbar region: Secondary | ICD-10-CM | POA: Diagnosis not present

## 2017-01-09 DIAGNOSIS — M5136 Other intervertebral disc degeneration, lumbar region: Secondary | ICD-10-CM | POA: Diagnosis not present

## 2017-01-30 DIAGNOSIS — M9903 Segmental and somatic dysfunction of lumbar region: Secondary | ICD-10-CM | POA: Diagnosis not present

## 2017-01-30 DIAGNOSIS — M5136 Other intervertebral disc degeneration, lumbar region: Secondary | ICD-10-CM | POA: Diagnosis not present

## 2017-02-06 DIAGNOSIS — M9903 Segmental and somatic dysfunction of lumbar region: Secondary | ICD-10-CM | POA: Diagnosis not present

## 2017-02-06 DIAGNOSIS — M5136 Other intervertebral disc degeneration, lumbar region: Secondary | ICD-10-CM | POA: Diagnosis not present

## 2017-02-13 DIAGNOSIS — M5136 Other intervertebral disc degeneration, lumbar region: Secondary | ICD-10-CM | POA: Diagnosis not present

## 2017-02-13 DIAGNOSIS — M9903 Segmental and somatic dysfunction of lumbar region: Secondary | ICD-10-CM | POA: Diagnosis not present

## 2017-02-17 DIAGNOSIS — M9903 Segmental and somatic dysfunction of lumbar region: Secondary | ICD-10-CM | POA: Diagnosis not present

## 2017-02-17 DIAGNOSIS — M5136 Other intervertebral disc degeneration, lumbar region: Secondary | ICD-10-CM | POA: Diagnosis not present

## 2017-02-27 DIAGNOSIS — M5136 Other intervertebral disc degeneration, lumbar region: Secondary | ICD-10-CM | POA: Diagnosis not present

## 2017-02-27 DIAGNOSIS — M9903 Segmental and somatic dysfunction of lumbar region: Secondary | ICD-10-CM | POA: Diagnosis not present

## 2017-03-06 DIAGNOSIS — M9903 Segmental and somatic dysfunction of lumbar region: Secondary | ICD-10-CM | POA: Diagnosis not present

## 2017-03-06 DIAGNOSIS — M5136 Other intervertebral disc degeneration, lumbar region: Secondary | ICD-10-CM | POA: Diagnosis not present

## 2017-03-06 DIAGNOSIS — G43909 Migraine, unspecified, not intractable, without status migrainosus: Secondary | ICD-10-CM | POA: Diagnosis not present

## 2017-03-06 DIAGNOSIS — R000 Tachycardia, unspecified: Secondary | ICD-10-CM | POA: Diagnosis not present

## 2017-03-13 DIAGNOSIS — M5136 Other intervertebral disc degeneration, lumbar region: Secondary | ICD-10-CM | POA: Diagnosis not present

## 2017-03-13 DIAGNOSIS — M9903 Segmental and somatic dysfunction of lumbar region: Secondary | ICD-10-CM | POA: Diagnosis not present

## 2017-03-20 DIAGNOSIS — M9903 Segmental and somatic dysfunction of lumbar region: Secondary | ICD-10-CM | POA: Diagnosis not present

## 2017-03-20 DIAGNOSIS — M5136 Other intervertebral disc degeneration, lumbar region: Secondary | ICD-10-CM | POA: Diagnosis not present

## 2017-03-27 DIAGNOSIS — M5136 Other intervertebral disc degeneration, lumbar region: Secondary | ICD-10-CM | POA: Diagnosis not present

## 2017-03-27 DIAGNOSIS — M9903 Segmental and somatic dysfunction of lumbar region: Secondary | ICD-10-CM | POA: Diagnosis not present

## 2017-03-28 ENCOUNTER — Telehealth: Payer: 59 | Admitting: Family

## 2017-03-28 DIAGNOSIS — J029 Acute pharyngitis, unspecified: Secondary | ICD-10-CM

## 2017-03-28 MED ORDER — BENZONATATE 100 MG PO CAPS: 100.0000 mg | ORAL_CAPSULE | Freq: Three times a day (TID) | ORAL | 0 refills | Status: DC | PRN

## 2017-03-28 MED ORDER — PREDNISONE 5 MG PO TABS: 5.0000 mg | ORAL_TABLET | ORAL | 0 refills | Status: DC

## 2017-03-28 NOTE — Progress Notes (Signed)
Thank you for the details you included in the comment boxes. Those details are very helpful in determining the best course of treatment for you and help us to provide the best care. The current standard of care is 5-7 days of illness before antibiotics are given. I wanted you to know this as it is the evidence-based guideline the E-Visit program operates by. However, approximately 89% of these infections are viral and they do respond very well to supportive care and measures to decrease inflammation, which we will provide for you below.   We are sorry that you are not feeling well.  Here is how we plan to help!  Based on your presentation I believe you most likely have A cough due to a virus.  This is called viral bronchitis and is best treated by rest, plenty of fluids and control of the cough.  You may use Ibuprofen or Tylenol as directed to help your symptoms.     In addition you may use A non-prescription cough medication called Mucinex DM: take 2 tablets every 12 hours. and A prescription cough medication called Tessalon Perles 100mg. You may take 1-2 capsules every 8 hours as needed for your cough.  Sterapred 5 mg dosepak  From your responses in the eVisit questionnaire you describe inflammation in the upper respiratory tract which is causing a significant cough.  This is commonly called Bronchitis and has four common causes:    Allergies  Viral Infections  Acid Reflux  Bacterial Infection Allergies, viruses and acid reflux are treated by controlling symptoms or eliminating the cause. An example might be a cough caused by taking certain blood pressure medications. You stop the cough by changing the medication. Another example might be a cough caused by acid reflux. Controlling the reflux helps control the cough.  USE OF BRONCHODILATOR ("RESCUE") INHALERS: There is a risk from using your bronchodilator too frequently.  The risk is that over-reliance on a medication which only relaxes the  muscles surrounding the breathing tubes can reduce the effectiveness of medications prescribed to reduce swelling and congestion of the tubes themselves.  Although you feel brief relief from the bronchodilator inhaler, your asthma may actually be worsening with the tubes becoming more swollen and filled with mucus.  This can delay other crucial treatments, such as oral steroid medications. If you need to use a bronchodilator inhaler daily, several times per day, you should discuss this with your provider.  There are probably better treatments that could be used to keep your asthma under control.     HOME CARE . Only take medications as instructed by your medical team. . Complete the entire course of an antibiotic. . Drink plenty of fluids and get plenty of rest. . Avoid close contacts especially the very young and the elderly . Cover your mouth if you cough or cough into your sleeve. . Always remember to wash your hands . A steam or ultrasonic humidifier can help congestion.   GET HELP RIGHT AWAY IF: . You develop worsening fever. . You become short of breath . You cough up blood. . Your symptoms persist after you have completed your treatment plan MAKE SURE YOU   Understand these instructions.  Will watch your condition.  Will get help right away if you are not doing well or get worse.  Your e-visit answers were reviewed by a board certified advanced clinical practitioner to complete your personal care plan.  Depending on the condition, your plan could have included both over   the counter or prescription medications. If there is a problem please reply  once you have received a response from your provider. Your safety is important to us.  If you have drug allergies check your prescription carefully.    You can use MyChart to ask questions about today's visit, request a non-urgent call back, or ask for a work or school excuse for 24 hours related to this e-Visit. If it has been greater than  24 hours you will need to follow up with your provider, or enter a new e-Visit to address those concerns. You will get an e-mail in the next two days asking about your experience.  I hope that your e-visit has been valuable and will speed your recovery. Thank you for using e-visits.    

## 2017-04-03 DIAGNOSIS — M9903 Segmental and somatic dysfunction of lumbar region: Secondary | ICD-10-CM | POA: Diagnosis not present

## 2017-04-03 DIAGNOSIS — M5136 Other intervertebral disc degeneration, lumbar region: Secondary | ICD-10-CM | POA: Diagnosis not present

## 2017-04-10 DIAGNOSIS — M9903 Segmental and somatic dysfunction of lumbar region: Secondary | ICD-10-CM | POA: Diagnosis not present

## 2017-04-10 DIAGNOSIS — M5136 Other intervertebral disc degeneration, lumbar region: Secondary | ICD-10-CM | POA: Diagnosis not present

## 2017-04-17 DIAGNOSIS — M5136 Other intervertebral disc degeneration, lumbar region: Secondary | ICD-10-CM | POA: Diagnosis not present

## 2017-04-17 DIAGNOSIS — M9903 Segmental and somatic dysfunction of lumbar region: Secondary | ICD-10-CM | POA: Diagnosis not present

## 2017-04-24 DIAGNOSIS — M9903 Segmental and somatic dysfunction of lumbar region: Secondary | ICD-10-CM | POA: Diagnosis not present

## 2017-04-24 DIAGNOSIS — M5136 Other intervertebral disc degeneration, lumbar region: Secondary | ICD-10-CM | POA: Diagnosis not present

## 2017-05-08 DIAGNOSIS — M9903 Segmental and somatic dysfunction of lumbar region: Secondary | ICD-10-CM | POA: Diagnosis not present

## 2017-05-08 DIAGNOSIS — M5136 Other intervertebral disc degeneration, lumbar region: Secondary | ICD-10-CM | POA: Diagnosis not present

## 2017-05-15 ENCOUNTER — Other Ambulatory Visit: Payer: 59 | Admitting: Women's Health

## 2017-05-15 DIAGNOSIS — M9903 Segmental and somatic dysfunction of lumbar region: Secondary | ICD-10-CM | POA: Diagnosis not present

## 2017-05-15 DIAGNOSIS — M5136 Other intervertebral disc degeneration, lumbar region: Secondary | ICD-10-CM | POA: Diagnosis not present

## 2017-05-22 ENCOUNTER — Ambulatory Visit (INDEPENDENT_AMBULATORY_CARE_PROVIDER_SITE_OTHER): Payer: 59 | Admitting: Women's Health

## 2017-05-22 ENCOUNTER — Other Ambulatory Visit (HOSPITAL_COMMUNITY)
Admission: RE | Admit: 2017-05-22 | Discharge: 2017-05-22 | Disposition: A | Discharge status: Home / Self Care | Payer: 59 | Source: Ambulatory Visit | Attending: Obstetrics & Gynecology | Admitting: Obstetrics & Gynecology

## 2017-05-22 ENCOUNTER — Encounter: Payer: Self-pay | Admitting: Women's Health

## 2017-05-22 VITALS — BP 100/70 | HR 86 | Ht 63.0 in | Wt 135.6 lb

## 2017-05-22 DIAGNOSIS — M9903 Segmental and somatic dysfunction of lumbar region: Secondary | ICD-10-CM | POA: Diagnosis not present

## 2017-05-22 DIAGNOSIS — Z01411 Encounter for gynecological examination (general) (routine) with abnormal findings: Secondary | ICD-10-CM

## 2017-05-22 DIAGNOSIS — Z01419 Encounter for gynecological examination (general) (routine) without abnormal findings: Secondary | ICD-10-CM | POA: Diagnosis not present

## 2017-05-22 DIAGNOSIS — N6320 Unspecified lump in the left breast, unspecified quadrant: Secondary | ICD-10-CM

## 2017-05-22 DIAGNOSIS — M5136 Other intervertebral disc degeneration, lumbar region: Secondary | ICD-10-CM | POA: Diagnosis not present

## 2017-05-22 NOTE — Progress Notes (Signed)
   WELL-WOMAN EXAMINATION Patient name: Madison Golden MRN [id number]  Date of birth: [birth year] Chief Complaint:   Gynecologic Exam (pap/physcial)  History of Present Illness:   Madison Golden is a 37 y.o. [phone] Caucasian female being seen today for a routine well-woman exam.  Current complaints: none  PCP: Dr. Garner Nash      does not desire labs Patient's last menstrual period was 05/03/2017. The current method of family planning is tubal ligation Last pap 05/08/15. Results were: neg w/ -HRHPV, wants another one this year Last mammogram: 05/23/15 for palpable abnormality Lt upper inner breast. Results were: Bi-rads 3, probably benign, was supposed to f/u after and didn't Last colonoscopy: never. Results were: n/a  Review of Systems:   Pertinent items are noted in HPI Denies any headaches, blurred vision, fatigue, shortness of breath, chest pain, abdominal pain, abnormal vaginal discharge/itching/odor/irritation, problems with periods, bowel movements, urination, or intercourse unless otherwise stated above. Pertinent History Reviewed:  Reviewed past medical,surgical, social and family history.  Reviewed problem list, medications and allergies. Physical Assessment:   Vitals:   05/22/17 1341  BP: 100/70  Pulse: 86  Weight: 135 lb 9.6 oz (61.5 kg)  Height:  (1.6 m)  Body mass index is 24.02 kg/m.        Physical Examination:   General appearance - well appearing, and in no distress  Mental status - alert, oriented to person, place, and time  Psych:  She has a normal mood and affect  Skin - warm and dry, normal color, no suspicious lesions noted  Chest - effort normal, all lung fields clear to auscultation bilaterally  Heart - normal rate and regular rhythm  Neck:  midline trachea, no thyromegaly or nodules  Breasts - breasts appear normal, Rt: no suspicious masses, no skin or nipple changes or axillary nodes Lt: firm ridge Lt upper inner breast, as noted in past, no  obvious changes from last exam  Abdomen - soft, nontender, nondistended, no masses or organomegaly  Pelvic - VULVA: normal appearing vulva with no masses, tenderness or lesions  VAGINA: normal appearing vagina with normal color and discharge, no lesions  CERVIX: normal appearing cervix without discharge or lesions, no CMT  Thin prep pap is done w/ HR HPV cotesting  UTERUS: uterus is felt to be normal size, shape, consistency and nontender   ADNEXA: No adnexal masses or tenderness noted.  Extremities:  No swelling or varicosities noted  No results found for this or any previous visit (from the past 24 hour(s)).  Assessment & Plan:  1) Well-Woman Exam  2) Known Lt breast abnormality> past due for f/u, to call and schedule  Labs/procedures today: pap  Mammogram- as above Colonoscopy  or sooner if problems  No orders of the defined types were placed in this encounter.   Follow-up: Return in about 1 year (around 05/23/2018) for Pap & physical.  Cheral Marker CNM, [facility] 05/22/2017 2:18 PM

## 2017-05-22 NOTE — Addendum Note (Signed)
Addended by: Federico Flake A on: 05/22/2017 02:31 PM   Modules accepted: Orders

## 2017-05-27 LAB — CYTOLOGY - PAP
Diagnosis: NEGATIVE
HPV: NOT DETECTED

## 2017-06-11 DIAGNOSIS — M5136 Other intervertebral disc degeneration, lumbar region: Secondary | ICD-10-CM | POA: Diagnosis not present

## 2017-06-11 DIAGNOSIS — M9903 Segmental and somatic dysfunction of lumbar region: Secondary | ICD-10-CM | POA: Diagnosis not present

## 2017-06-19 DIAGNOSIS — M9903 Segmental and somatic dysfunction of lumbar region: Secondary | ICD-10-CM | POA: Diagnosis not present

## 2017-06-19 DIAGNOSIS — M5136 Other intervertebral disc degeneration, lumbar region: Secondary | ICD-10-CM | POA: Diagnosis not present

## 2017-07-02 DIAGNOSIS — M5136 Other intervertebral disc degeneration, lumbar region: Secondary | ICD-10-CM | POA: Diagnosis not present

## 2017-07-02 DIAGNOSIS — M9903 Segmental and somatic dysfunction of lumbar region: Secondary | ICD-10-CM | POA: Diagnosis not present

## 2017-07-16 DIAGNOSIS — G43909 Migraine, unspecified, not intractable, without status migrainosus: Secondary | ICD-10-CM | POA: Diagnosis not present

## 2017-07-16 DIAGNOSIS — R000 Tachycardia, unspecified: Secondary | ICD-10-CM | POA: Diagnosis not present

## 2017-07-16 DIAGNOSIS — M9903 Segmental and somatic dysfunction of lumbar region: Secondary | ICD-10-CM | POA: Diagnosis not present

## 2017-07-16 DIAGNOSIS — M5136 Other intervertebral disc degeneration, lumbar region: Secondary | ICD-10-CM | POA: Diagnosis not present

## 2017-07-23 DIAGNOSIS — M9903 Segmental and somatic dysfunction of lumbar region: Secondary | ICD-10-CM | POA: Diagnosis not present

## 2017-07-23 DIAGNOSIS — M5136 Other intervertebral disc degeneration, lumbar region: Secondary | ICD-10-CM | POA: Diagnosis not present

## 2017-08-04 DIAGNOSIS — M9903 Segmental and somatic dysfunction of lumbar region: Secondary | ICD-10-CM | POA: Diagnosis not present

## 2017-08-04 DIAGNOSIS — M5136 Other intervertebral disc degeneration, lumbar region: Secondary | ICD-10-CM | POA: Diagnosis not present

## 2017-08-11 DIAGNOSIS — M9903 Segmental and somatic dysfunction of lumbar region: Secondary | ICD-10-CM | POA: Diagnosis not present

## 2017-08-11 DIAGNOSIS — M5136 Other intervertebral disc degeneration, lumbar region: Secondary | ICD-10-CM | POA: Diagnosis not present

## 2017-08-25 ENCOUNTER — Telehealth: Payer: 59 | Admitting: Family

## 2017-08-25 DIAGNOSIS — H6011 Cellulitis of right external ear: Secondary | ICD-10-CM

## 2017-08-25 MED ORDER — DOXYCYCLINE HYCLATE 100 MG PO TABS: 100.0000 mg | ORAL_TABLET | Freq: Two times a day (BID) | ORAL | 0 refills | Status: DC

## 2017-08-25 MED FILL — DOXYCYCLINE HYCLATE 100 MG: 100 | 10 days supply | Qty: 20 | Fill #0

## 2017-08-25 NOTE — Progress Notes (Signed)
E Visit for Rash  We are sorry that you are not feeling well. Here is how we plan to help!  You have a cellulitis of the ear. This is an infection. I have prescribed a medication and sent it to your pharmacy.  Doxycycline 100 mg twice per day for 7 days      HOME CARE:   Take cool showers and avoid direct sunlight.  Apply cool compress or wet dressings.  Take a bath in an oatmeal bath.  Sprinkle content of one Aveeno packet under running faucet with comfortably warm water.  Bathe for 15-20 minutes, 1-2 times daily.  Pat dry with a towel. Do not rub the rash.  Use hydrocortisone cream.  Take an antihistamine like Benadryl for widespread rashes that itch.  The adult dose of Benadryl is 25-50 mg by mouth 4 times daily.  Caution:  This type of medication may cause sleepiness.  Do not drink alcohol, drive, or operate dangerous machinery while taking antihistamines.  Do not take these medications if you have prostate enlargement.  Read package instructions thoroughly on all medications that you take.  GET HELP RIGHT AWAY IF:   Symptoms don't go away after treatment.  Severe itching that persists.  If you rash spreads or swells.  If you rash begins to smell.  If it blisters and opens or develops a yellow-brown crust.  You develop a fever.  You have a sore throat.  You become short of breath.  MAKE SURE YOU:  Understand these instructions. Will watch your condition. Will get help right away if you are not doing well or get worse.  Thank you for choosing an e-visit. Your e-visit answers were reviewed by a board certified advanced clinical practitioner to complete your personal care plan. Depending upon the condition, your plan could have included both over the counter or prescription medications. Please review your pharmacy choice. Be sure that the pharmacy you have chosen is open so that you can pick up your prescription now.  If there is a problem you may message your  provider in MyChart to have the prescription routed to another pharmacy. Your safety is important to us. If you have drug allergies check your prescription carefully.  For the next 24 hours, you can use MyChart to ask questions about today's visit, request a non-urgent call back, or ask for a work or school excuse from your e-visit provider. You will get an email in the next two days asking about your experience. I hope that your e-visit has been valuable and will speed your recovery.

## 2018-01-14 DIAGNOSIS — M5136 Other intervertebral disc degeneration, lumbar region: Secondary | ICD-10-CM | POA: Diagnosis not present

## 2018-01-14 DIAGNOSIS — M9903 Segmental and somatic dysfunction of lumbar region: Secondary | ICD-10-CM | POA: Diagnosis not present

## 2018-01-21 DIAGNOSIS — M9903 Segmental and somatic dysfunction of lumbar region: Secondary | ICD-10-CM | POA: Diagnosis not present

## 2018-01-21 DIAGNOSIS — M5136 Other intervertebral disc degeneration, lumbar region: Secondary | ICD-10-CM | POA: Diagnosis not present

## 2018-02-09 DIAGNOSIS — M5136 Other intervertebral disc degeneration, lumbar region: Secondary | ICD-10-CM | POA: Diagnosis not present

## 2018-02-09 DIAGNOSIS — M9903 Segmental and somatic dysfunction of lumbar region: Secondary | ICD-10-CM | POA: Diagnosis not present

## 2018-02-19 DIAGNOSIS — M5136 Other intervertebral disc degeneration, lumbar region: Secondary | ICD-10-CM | POA: Diagnosis not present

## 2018-02-19 DIAGNOSIS — M9903 Segmental and somatic dysfunction of lumbar region: Secondary | ICD-10-CM | POA: Diagnosis not present

## 2018-02-26 DIAGNOSIS — M5136 Other intervertebral disc degeneration, lumbar region: Secondary | ICD-10-CM | POA: Diagnosis not present

## 2018-02-26 DIAGNOSIS — M9903 Segmental and somatic dysfunction of lumbar region: Secondary | ICD-10-CM | POA: Diagnosis not present

## 2018-03-09 DIAGNOSIS — M5136 Other intervertebral disc degeneration, lumbar region: Secondary | ICD-10-CM | POA: Diagnosis not present

## 2018-03-09 DIAGNOSIS — M9903 Segmental and somatic dysfunction of lumbar region: Secondary | ICD-10-CM | POA: Diagnosis not present

## 2018-03-24 DIAGNOSIS — M9903 Segmental and somatic dysfunction of lumbar region: Secondary | ICD-10-CM | POA: Diagnosis not present

## 2018-03-24 DIAGNOSIS — M5136 Other intervertebral disc degeneration, lumbar region: Secondary | ICD-10-CM | POA: Diagnosis not present

## 2018-03-31 DIAGNOSIS — M5136 Other intervertebral disc degeneration, lumbar region: Secondary | ICD-10-CM | POA: Diagnosis not present

## 2018-03-31 DIAGNOSIS — M9903 Segmental and somatic dysfunction of lumbar region: Secondary | ICD-10-CM | POA: Diagnosis not present

## 2018-04-08 DIAGNOSIS — M5136 Other intervertebral disc degeneration, lumbar region: Secondary | ICD-10-CM | POA: Diagnosis not present

## 2018-04-08 DIAGNOSIS — M9903 Segmental and somatic dysfunction of lumbar region: Secondary | ICD-10-CM | POA: Diagnosis not present

## 2018-04-14 DIAGNOSIS — M9903 Segmental and somatic dysfunction of lumbar region: Secondary | ICD-10-CM | POA: Diagnosis not present

## 2018-04-14 DIAGNOSIS — M5136 Other intervertebral disc degeneration, lumbar region: Secondary | ICD-10-CM | POA: Diagnosis not present

## 2018-04-27 DIAGNOSIS — M5136 Other intervertebral disc degeneration, lumbar region: Secondary | ICD-10-CM | POA: Diagnosis not present

## 2018-04-27 DIAGNOSIS — M9903 Segmental and somatic dysfunction of lumbar region: Secondary | ICD-10-CM | POA: Diagnosis not present

## 2018-05-04 DIAGNOSIS — M5136 Other intervertebral disc degeneration, lumbar region: Secondary | ICD-10-CM | POA: Diagnosis not present

## 2018-05-04 DIAGNOSIS — M9903 Segmental and somatic dysfunction of lumbar region: Secondary | ICD-10-CM | POA: Diagnosis not present

## 2018-05-11 DIAGNOSIS — M9903 Segmental and somatic dysfunction of lumbar region: Secondary | ICD-10-CM | POA: Diagnosis not present

## 2018-05-11 DIAGNOSIS — M5136 Other intervertebral disc degeneration, lumbar region: Secondary | ICD-10-CM | POA: Diagnosis not present

## 2018-05-18 DIAGNOSIS — M5136 Other intervertebral disc degeneration, lumbar region: Secondary | ICD-10-CM | POA: Diagnosis not present

## 2018-05-18 DIAGNOSIS — M9903 Segmental and somatic dysfunction of lumbar region: Secondary | ICD-10-CM | POA: Diagnosis not present

## 2018-05-20 DIAGNOSIS — G43909 Migraine, unspecified, not intractable, without status migrainosus: Secondary | ICD-10-CM | POA: Diagnosis not present

## 2018-05-20 DIAGNOSIS — R000 Tachycardia, unspecified: Secondary | ICD-10-CM | POA: Diagnosis not present

## 2018-05-28 DIAGNOSIS — M5136 Other intervertebral disc degeneration, lumbar region: Secondary | ICD-10-CM | POA: Diagnosis not present

## 2018-05-28 DIAGNOSIS — M9903 Segmental and somatic dysfunction of lumbar region: Secondary | ICD-10-CM | POA: Diagnosis not present

## 2018-07-01 ENCOUNTER — Other Ambulatory Visit: Payer: 59 | Admitting: Women's Health

## 2018-07-14 ENCOUNTER — Other Ambulatory Visit: Payer: 59 | Admitting: Adult Health

## 2018-08-24 ENCOUNTER — Other Ambulatory Visit: Payer: 59 | Admitting: Adult Health

## 2018-10-28 ENCOUNTER — Other Ambulatory Visit: Payer: 59 | Admitting: Adult Health

## 2018-12-29 ENCOUNTER — Encounter: Payer: Self-pay | Admitting: Women's Health

## 2018-12-29 ENCOUNTER — Other Ambulatory Visit (HOSPITAL_COMMUNITY)
Admission: RE | Admit: 2018-12-29 | Discharge: 2018-12-29 | Disposition: A | Discharge status: Home / Self Care | Payer: 59 | Source: Ambulatory Visit | Attending: Adult Health | Admitting: Adult Health

## 2018-12-29 ENCOUNTER — Ambulatory Visit (INDEPENDENT_AMBULATORY_CARE_PROVIDER_SITE_OTHER): Payer: 59 | Admitting: Women's Health

## 2018-12-29 ENCOUNTER — Other Ambulatory Visit: Payer: Self-pay

## 2018-12-29 VITALS — BP 117/81 | HR 71 | Ht 62.0 in | Wt 144.5 lb

## 2018-12-29 DIAGNOSIS — Z01419 Encounter for gynecological examination (general) (routine) without abnormal findings: Secondary | ICD-10-CM | POA: Diagnosis present

## 2018-12-29 DIAGNOSIS — R928 Other abnormal and inconclusive findings on diagnostic imaging of breast: Secondary | ICD-10-CM

## 2018-12-29 NOTE — Patient Instructions (Signed)
Breast ultrasound and mammogram 1/5 @ 11:20 at [facility], be there at 11:00, no lotion/deoderant/powder/perfume that day

## 2018-12-29 NOTE — Progress Notes (Signed)
WELL-WOMAN EXAMINATION Patient name: Madison Golden MRN [id number]  Date of birth: 04-25-1980 Chief Complaint:   Gynecologic Exam  History of Present Illness:   Madison Golden is a 38 y.o. [phone] Caucasian female being seen today for a routine well-woman exam.  Current complaints: has had 4-5 episodes of heart racing in the last month, has h/o SVT. HR will be in 160s-170s, vagal maneuver works most of the time to bring it down. No other sx. Working a lot lately. After tomorrow's shift off x 2wks. 1 cup coffee daily, no other caffeine, no etoh/smoking. Takes xanax prn for anxiety.  Goes to PCP next week 12/30, plans to discuss with them.  PC[facility]: [facility]      does not desire labs Patient's last menstrual period was 12/15/2018 (approximate). The current method of family planning is tubal ligation.  Last pap 05/12/17. Results were: normal. H/O abnormal pap: Yes, many paps ago, wants yearly paps Last mammogram: 2017 for palpable abnormality Lt upper/inner breast. Results were: bi-rads 3, probably benign, was supposed to f/u later and didn't. We discussed this May 2019 and pt states she called to schedule and was told there was no order, contacted our office and didn't hear anything. Feels it could possibly be slightly larger, also when wearing bra, feels something pushing on Lt lateral side of breast. Family h/o breast cancer: No Last colonoscopy: never. Results were: n/a. Family h/o colorectal cancer: No Review of Systems:   Pertinent items are noted in HPI Denies any headaches, blurred vision, fatigue, shortness of breath, chest pain, abdominal pain, abnormal vaginal discharge/itching/odor/irritation, problems with periods, bowel movements, urination, or intercourse unless otherwise stated above. Pertinent History Reviewed:  Reviewed past medical,surgical, social and family history.  Reviewed problem list, medications and allergies. Physical Assessment:   Vitals:   12/29/18 0844  BP:  117/81  Pulse: 71  Weight: 144 lb 8 oz (65.5 kg)  Height: 5\' 2"  (1.575 m)  Body mass index is 26.43 kg/m.        Physical Examination:   General appearance - well appearing, and in no distress  Mental status - alert, oriented to person, place, and time  Psych:  She has a normal mood and affect  Skin - warm and dry, normal color, no suspicious lesions noted  Chest - effort normal, all lung fields clear to auscultation bilaterally  Heart - normal rate and regular rhythm  Neck:  midline trachea, no thyromegaly or nodules  Breasts - breasts appear normal, Rt: no suspicious masses, no skin or nipple changes or axillary nodes. Lt: firm ridge Lt upper inner breast, as noted in past, no obvious changes from last exam  Abdomen - soft, nontender, nondistended, no masses or organomegaly  Pelvic - VULVA: normal appearing vulva with no masses, tenderness or lesions  VAGINA: normal appearing vagina with normal color and discharge, no lesions  CERVIX: normal appearing cervix without discharge or lesions, no CMT  Thin prep pap is done w/ HR HPV cotesting  UTERUS: uterus is felt to be normal size, shape, consistency and nontender   ADNEXA: No adnexal masses or tenderness noted.  Extremities:  No swelling or varicosities noted  Chaperone: Peggy Dones    No results found for this or any previous visit (from the past 24 hour(s)).  Assessment & Plan:  1) Well-Woman Exam  2) Episodes of SVT> goes to PCP next week, will discuss w/ them  3) Known Lt breast abnormality> f/u mammo and scheduled for  1/5 @ 1120, be there at 1110, no lotion/powder/deoderant/perfume  Labs/procedures today: pap  Mammogram as scheduled  Colonoscopy @36yo  or sooner if problems  Orders Placed This Encounter  Procedures  . MM DIAG BREAST TOMO BILATERAL  . US BREAST LTD UNI LEFT INC AXILLA  . US BREAST LTD UNI RIGHT INC AXILLA    Meds: No orders of the defined types were placed in this encounter.   Follow-up: Return  in about 1 year (around 12/29/2019) for Physical.  [place], [facility] 12/29/2018 9:32 AM

## 2019-01-05 ENCOUNTER — Encounter: Payer: Self-pay | Admitting: Women's Health

## 2019-01-05 DIAGNOSIS — R87810 Cervical high risk human papillomavirus (HPV) DNA test positive: Secondary | ICD-10-CM | POA: Insufficient documentation

## 2019-01-05 LAB — CYTOLOGY - PAP
Comment: NEGATIVE
Comment: NEGATIVE
Comment: NEGATIVE
Diagnosis: NEGATIVE
HPV 16: NEGATIVE
HPV 18 / 45: POSITIVE — AB
High risk HPV: POSITIVE — AB

## 2019-01-11 ENCOUNTER — Other Ambulatory Visit: Payer: Self-pay

## 2019-01-11 ENCOUNTER — Ambulatory Visit (INDEPENDENT_AMBULATORY_CARE_PROVIDER_SITE_OTHER): Payer: 59 | Admitting: Obstetrics & Gynecology

## 2019-01-11 ENCOUNTER — Encounter: Payer: Self-pay | Admitting: Obstetrics & Gynecology

## 2019-01-11 VITALS — BP 114/75 | HR 75 | Ht 62.0 in | Wt 144.0 lb

## 2019-01-11 DIAGNOSIS — Z3202 Encounter for pregnancy test, result negative: Secondary | ICD-10-CM

## 2019-01-11 DIAGNOSIS — R87810 Cervical high risk human papillomavirus (HPV) DNA test positive: Secondary | ICD-10-CM

## 2019-01-11 NOTE — Progress Notes (Signed)
Colposcopy Procedure Note:  Colposcopy Procedure Note  HPV based cytology: Year                 Cytology             HPV 2020                  Negative           -16/+18 2019                  Negative            n/a 2018                  Negative  Smoker:  No. New sexual partner:  No.    History of abnormal Pap: no  Procedure Details  The risks and benefits of the procedure and Written informed consent obtained.  Speculum placed in vagina and excellent visualization of cervix achieved, cervix swabbed x 3 with acetic acid solution.  Findings: Cervix: no visible lesions, no mosaicism, no punctation and no abnormal vasculature; SCJ visualized 360 degrees without lesions and no biopsies taken. Vaginal inspection: normal without visible lesions. Vulvar colposcopy: vulvar colposcopy not performed.  Specimens: none  Complications: none.  Plan: HPV based cytology 1 year

## 2019-01-12 ENCOUNTER — Encounter (HOSPITAL_COMMUNITY): Payer: 59

## 2019-01-12 ENCOUNTER — Ambulatory Visit (HOSPITAL_COMMUNITY): Payer: 59

## 2019-01-19 ENCOUNTER — Ambulatory Visit (HOSPITAL_COMMUNITY): Payer: 59

## 2019-01-19 ENCOUNTER — Encounter (HOSPITAL_COMMUNITY): Payer: 59

## 2019-01-26 ENCOUNTER — Ambulatory Visit (HOSPITAL_COMMUNITY)
Admission: RE | Admit: 2019-01-26 | Discharge: 2019-01-26 | Disposition: A | Discharge status: Home / Self Care | Payer: 59 | Source: Ambulatory Visit | Attending: Women's Health | Admitting: Women's Health

## 2019-01-26 ENCOUNTER — Ambulatory Visit (HOSPITAL_COMMUNITY): Payer: 59

## 2019-01-26 ENCOUNTER — Other Ambulatory Visit: Payer: Self-pay

## 2019-01-26 ENCOUNTER — Ambulatory Visit (HOSPITAL_COMMUNITY): Admission: RE | Admit: 2019-01-26 | Payer: 59 | Source: Ambulatory Visit

## 2019-01-26 DIAGNOSIS — R928 Other abnormal and inconclusive findings on diagnostic imaging of breast: Secondary | ICD-10-CM

## 2019-02-04 ENCOUNTER — Ambulatory Visit: Payer: 59 | Admitting: Internal Medicine

## 2019-02-04 ENCOUNTER — Other Ambulatory Visit: Payer: Self-pay

## 2019-02-04 ENCOUNTER — Encounter: Payer: Self-pay | Admitting: Internal Medicine

## 2019-02-04 ENCOUNTER — Telehealth: Payer: Self-pay | Admitting: Radiology

## 2019-02-04 VITALS — BP 124/82 | HR 75 | Ht 62.0 in | Wt 142.8 lb

## 2019-02-04 DIAGNOSIS — R002 Palpitations: Secondary | ICD-10-CM | POA: Diagnosis not present

## 2019-02-04 NOTE — Patient Instructions (Addendum)
Medication Instructions:  Your physician recommends that you continue on your current medications as directed. Please refer to the Current Medication list given to you today.  Labwork: None ordered.  Testing/Procedures: Your physician has recommended that you wear a holter monitor. Holter monitors are medical devices that record the heart's electrical activity. Doctors most often use these monitors to diagnose arrhythmias. Arrhythmias are problems with the speed or rhythm of the heartbeat. The monitor is a small, portable device. You can wear one while you do your normal daily activities. This is usually used to diagnose what is causing palpitations/syncope (passing out).  You will receive your monitor in the mail.  Follow-Up: Your physician wants you to follow-up in: based on results of monitor.  Any Other Special Instructions Will Be Listed Below (If Applicable).  If you need a refill on your cardiac medications before your next appointment, please call your pharmacy.   ZIO XT- Long Term Monitor Instructions   Your physician has requested you wear your ZIO patch monitor__14___days.   This is a single patch monitor.  [employer] supplies one patch monitor per enrollment.  Additional stickers are not available.   Please do not apply patch if you will be having a Nuclear Stress Test, Echocardiogram, Cardiac CT, MRI, or Chest Xray during the time frame you would be wearing the monitor. The patch cannot be worn during these tests.  You cannot remove and re-apply the ZIO XT patch monitor.   Your ZIO patch monitor will be sent USPS Priority mail from [facility] directly to your home address. The monitor may also be mailed to a PO BOX if home delivery is not available.   It may take 3-5 days to receive your monitor after you have been enrolled.   Once you have received you monitor, please review enclosed instructions.  Your monitor has already been registered assigning a specific monitor  serial # to you.   Applying the monitor   Shave hair from upper left chest.   Hold abrader disc by orange tab.  Rub abrader in 40 strokes over left upper chest as indicated in your monitor instructions.   Clean area with 4 enclosed alcohol pads .  Use all pads to assure are is cleaned thoroughly.  Let dry.   Apply patch as indicated in monitor instructions.  Patch will be place under collarbone on left side of chest with arrow pointing upward.   Rub patch adhesive wings for 2 minutes.Remove white label marked "1".  Remove white label marked "2".  Rub patch adhesive wings for 2 additional minutes.   While looking in a mirror, press and release button in center of patch.  A small green light will flash 3-4 times .  This will be your only indicator the monitor has been turned on.     Do not shower for the first 24 hours.  You may shower after the first 24 hours.   Press button if you feel a symptom. You will hear a small click.  Record Date, Time and Symptom in the Patient Log Book.   When you are ready to remove patch, follow instructions on last 2 pages of Patient Log Book.  Stick patch monitor onto last page of Patient Log Book.   Place Patient Log Book in [place] box.  Use locking tab on box and tape box closed securely.  The [employer] has [employer] on it.  Please place in mailbox as soon as possible.  Your physician  should have your test results approximately 7 days after the monitor has been mailed back to [facility].   Call [place] at [phone] if you have questions regarding your ZIO XT patch monitor.  Call them immediately if you see an orange light blinking on your monitor.   If your monitor falls off in less than 4 days contact our Monitor department at [phone].  If your monitor becomes loose or falls off after 4 days call [employer] at [phone] for suggestions on securing your monitor.

## 2019-02-04 NOTE — Progress Notes (Signed)
HPI Madison Golden is referred today for evaluation of palpitations and possible SVT. She is a very pleasant 39 yo woman with palpitations who has recorded HR's in the 150 range with her pulse ox. She notes that the episodes often occur when she gets out of bed in the morning but also at other times. At times she can strain or bare down and the episodes terminate. She has not had syncope and no associated chest pain or sob. She has never had to seek medical attention.  No Known Allergies   Current Outpatient Medications  Medication Sig Dispense Refill  . ALPRAZolam (XANAX) 0.5 MG tablet Take 0.25 mg by mouth at bedtime as needed for anxiety.    . fluticasone (FLONASE) 50 MCG/ACT nasal spray Place 2 sprays into both nostrils daily. 16 g 0   No current facility-administered medications for this visit.     Past Medical History:  Diagnosis Date  . Abnormal Pap smear   . Depression   . Echocardiogram abnormal 12/2008   EF 55-60%  . Endometriosis   . Mass of breast, left   . Palpitations    Event recorder, 01/2009 NSR, sinus tachy  . Panic attacks   . Seasonal allergies   . Sinus tachycardia     ROS:   All systems reviewed and negative except as noted in the HPI.   Past Surgical History:  Procedure Laterality Date  . CESAREAN SECTION    . LAPAROSCOPIC ENDOMETRIOSIS FULGURATION  2009  . LEEP  2012  . TUBAL LIGATION Bilateral 11/10/2012   Procedure: POST PARTUM TUBAL LIGATION;  Surgeon: Lavonia Drafts, MD;  Location: [facility];  Service: Gynecology;  Laterality: Bilateral;     Family History  Problem Relation Age of Onset  . Cancer Maternal Grandmother        lung  . Diabetes Maternal Grandmother   . Hypertension Maternal Grandmother   . Stroke Paternal Grandmother   . Cirrhosis Maternal Grandfather   . Coronary artery disease Neg Hx      Social History   Socioeconomic History  . Marital status: Married    Spouse name: Not on file  . Number of children:  Not on file  . Years of education: Not on file  . Highest education level: Not on file  Occupational History  . Occupation: [employer]    Employer: OTHER    Comment: 25-30 hours/ week  Tobacco Use  . Smoking status: Never Smoker  . Smokeless tobacco: Never Used  Substance and Sexual Activity  . Alcohol use: No  . Drug use: No  . Sexual activity: Yes    Birth control/protection: Surgical    Comment: tubal  Other Topics Concern  . Not on file  Social History Narrative   Divorced   2 children   In nursing school at [employer] Determinants of Health   Financial Resource Strain:   . Difficulty of Paying Living Expenses: Not on file  Food Insecurity:   . Worried About Charity fundraiser in the Last Year: Not on file  . Ran Out of Food in the Last Year: Not on file  Transportation Needs:   . Lack of Transportation (Medical): Not on file  . Lack of Transportation (Non-Medical): Not on file  Physical Activity:   . Days of Exercise per Week: Not on file  . Minutes of Exercise per Session: Not on file  Stress:   . Feeling of Stress :  Not on file  Social Connections:   . Frequency of Communication with Friends and Family: Not on file  . Frequency of Social Gatherings with Friends and Family: Not on file  . Attends Religious Services: Not on file  . Active Member of Clubs or Organizations: Not on file  . Attends Banker Meetings: Not on file  . Marital Status: Not on file  Intimate Partner Violence:   . Fear of Current or Ex-Partner: Not on file  . Emotionally Abused: Not on file  . Physically Abused: Not on file  . Sexually Abused: Not on file     BP 124/82   Pulse 75   Ht 5\' 2"  (1.575 m)   Wt 142 lb 12.8 oz (64.8 kg)   LMP 01/05/2019   SpO2 99%   BMI 26.12 kg/m   Physical Exam:  Well appearing NAD HEENT: Unremarkable Neck:  No JVD, no thyromegally Lymphatics:  No adenopathy Back:  No CVA tenderness Lungs:  Clear with no  wheezes HEART:  Regular rate rhythm, no murmurs, no rubs, no clicks Abd:  soft, positive bowel sounds, no organomegally, no rebound, no guarding Ext:  2 plus pulses, no edema, no cyanosis, no clubbing Skin:  No rashes no nodules Neuro:  CN II through XII intact, motor grossly intact  EKG - nsr with no pre-excitation  Assess/Plan: 1. Palpitations - it is unclear as to whether or not the patient is experiencing SVT or IST. I have asked her to wear a cardiac monitor for 14 days.  I spent 45 minutes including 50% face to face time with the patient.  01/07/2019.D.

## 2019-02-04 NOTE — Telephone Encounter (Signed)
Enrolled patient for a 14 day Zio monitor to be mailed to patients home.  

## 2019-02-08 ENCOUNTER — Other Ambulatory Visit (INDEPENDENT_AMBULATORY_CARE_PROVIDER_SITE_OTHER): Payer: 59

## 2019-02-08 DIAGNOSIS — R002 Palpitations: Secondary | ICD-10-CM | POA: Diagnosis not present

## 2019-05-06 MED FILL — AMOXICILLIN 400 MG/5 ML SUS: 400 | 5 days supply | Qty: 200 | Fill #0

## 2020-01-03 ENCOUNTER — Encounter: Payer: Self-pay | Admitting: Women's Health

## 2020-01-03 ENCOUNTER — Other Ambulatory Visit: Payer: Self-pay

## 2020-01-03 ENCOUNTER — Ambulatory Visit (INDEPENDENT_AMBULATORY_CARE_PROVIDER_SITE_OTHER): Payer: 59 | Admitting: Women's Health

## 2020-01-03 ENCOUNTER — Other Ambulatory Visit (HOSPITAL_COMMUNITY)
Admission: RE | Admit: 2020-01-03 | Discharge: 2020-01-03 | Disposition: A | Discharge status: Home / Self Care | Payer: 59 | Source: Ambulatory Visit | Attending: Obstetrics & Gynecology | Admitting: Obstetrics & Gynecology

## 2020-01-03 VITALS — BP 131/85 | HR 67 | Ht 62.0 in | Wt 150.0 lb

## 2020-01-03 DIAGNOSIS — Z01419 Encounter for gynecological examination (general) (routine) without abnormal findings: Secondary | ICD-10-CM

## 2020-01-03 DIAGNOSIS — R87810 Cervical high risk human papillomavirus (HPV) DNA test positive: Secondary | ICD-10-CM

## 2020-01-03 NOTE — Progress Notes (Signed)
WELL-WOMAN EXAMINATION Patient name: Madison Golden MRN [id number]  Date of birth: [birth year] Chief Complaint:   Gynecologic Exam (Pap/ physical)  History of Present Illness:   Madison Golden is a 39 y.o. [phone] Caucasian female being seen today for a routine well-woman exam.  Current complaints: bp slightly up for her, has gained a little weight. Nervous about pap, d/t +HRHPV last year.   Depression screen [facility] 2/9 01/03/2020 12/29/2018 05/22/2017 05/13/2016  Decreased Interest 1 0 0 0  Down, Depressed, Hopeless 1 0 0 0  PHQ - 2 Score 2 0 0 0  Altered sleeping 1 - 1 -  Tired, decreased energy 1 - 0 -  Change in appetite 1 - 0 -  Feeling bad or failure about yourself  1 - 0 -  Trouble concentrating 0 - 0 -  Moving slowly or fidgety/restless 0 - 0 -  Suicidal thoughts 0 - 0 -  PHQ-9 Score 6 - 1 -  Difficult doing work/chores Somewhat difficult - Not difficult at all -     PCP: [facility]      does not desire labs Patient's last menstrual period was 12/04/2019. The current method of family planning is tubal ligation.  Last pap 12/29/18. Results were: abnormal neg pap w/ +HRHPV (18/45) w/ normal colpo. H/O abnormal pap: yes w/ LEEP in 2012 Last mammogram: 01/26/19 f/u asymmetry upper/inner Lt breast. Results were: normal. Family h/o breast cancer: no Last colonoscopy: never. Results were: N/A. Family h/o colorectal cancer: no Review of Systems:   Pertinent items are noted in HPI Denies any headaches, blurred vision, fatigue, shortness of breath, chest pain, abdominal pain, abnormal vaginal discharge/itching/odor/irritation, problems with periods, bowel movements, urination, or intercourse unless otherwise stated above. Pertinent History Reviewed:  Reviewed past medical,surgical, social and family history.  Reviewed problem list, medications and allergies. Physical Assessment:   Vitals:   01/03/20 1409  BP: 131/85  Pulse: 67  Weight: 150 lb (68 kg)  Height: 5\' 2"  (1.575 m)   Body mass index is 27.44 kg/m.        Physical Examination:   General appearance - well appearing, and in no distress  Mental status - alert, oriented to person, place, and time  Psych:  She has a normal mood and affect  Skin - warm and dry, normal color, no suspicious lesions noted  Chest - effort normal, all lung fields clear to auscultation bilaterally  Heart - normal rate and regular rhythm  Neck:  midline trachea, no thyromegaly or nodules  Breasts - breasts appear normal, no suspicious masses, no skin or nipple changes or  axillary nodes  Abdomen - soft, nontender, nondistended, no masses or organomegaly  Pelvic - VULVA: normal appearing vulva with no masses, tenderness or lesions  VAGINA: normal appearing vagina with normal color and discharge, no lesions  CERVIX: normal appearing cervix without discharge or lesions, no CMT  Thin prep pap is done w/ HR HPV cotesting  UTERUS: uterus is felt to be normal size, shape, consistency and nontender   ADNEXA: No adnexal masses or tenderness noted.  Extremities:  No swelling or varicosities noted  Chaperone: Angel Neas    No results found for this or any previous visit (from the past 24 hour(s)).  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   Meds: No orders of the defined types were placed in this encounter.   Follow-up: Return in about 1 year (around 01/02/2021) for Physical.  Cheral Marker CNM, [facility] 01/03/2020 2:44 PM

## 2020-01-16 LAB — CYTOLOGY - PAP
Comment: NEGATIVE
Comment: NEGATIVE
Diagnosis: NEGATIVE
HPV 16: NEGATIVE
HPV 18 / 45: POSITIVE — AB
High risk HPV: POSITIVE — AB

## 2020-02-07 ENCOUNTER — Ambulatory Visit (INDEPENDENT_AMBULATORY_CARE_PROVIDER_SITE_OTHER): Payer: 59 | Admitting: Women's Health

## 2020-02-07 ENCOUNTER — Encounter: Payer: Self-pay | Admitting: Women's Health

## 2020-02-07 ENCOUNTER — Other Ambulatory Visit: Payer: Self-pay

## 2020-02-07 VITALS — BP 105/59 | HR 67 | Ht 62.0 in | Wt 147.8 lb

## 2020-02-07 DIAGNOSIS — R87810 Cervical high risk human papillomavirus (HPV) DNA test positive: Secondary | ICD-10-CM | POA: Diagnosis not present

## 2020-02-07 NOTE — Progress Notes (Signed)
   COLPOSCOPY PROCEDURE NOTE Patient name: Madison Golden MRN [id number]  Date of birth: 11/13/1980 Subjective Findings:   Madison Golden is a 40 y.o. [phone] Caucasian female being seen today for a colposcopy. Indication: Abnormal pap: NILM w/ HRHPV positive - 18/45  Prior cytology:  Date Result Procedure  12/29/18 NILM w/ HRHPV positive - 18/45 Coloscopy: normal 01/11/19  2012-2019 NILM w/ -HRHPV   11/22/09 LSIL-H Colpo: HSIL>LEEP  Patient's last menstrual period was 02/02/2020 (exact date). Contraception: tubal ligation. Menopausal: no. Hysterectomy: no.   Smoker: No.  Immunocompromised: no.  The risks and benefits were explained and informed consent was obtained, and written copy is in chart. Pertinent History Reviewed:   Reviewed past medical,surgical, social, obstetrical and family history.  Reviewed problem list, medications and allergies. Objective Findings & Procedure:   Vitals:   02/07/20 1129  BP: (!) 105/59  Pulse: 67  Weight: 147 lb 12.8 oz (67 kg)  Height: 5\' 2"  (1.575 m)  Body mass index is 27.03 kg/m.  No results found for this or any previous visit (from the past 24 hour(s)).   Time out was performed.  This colpo performed by [facility] w/ me observing  Speculum placed in the vagina, cervix fully visualized. SCJ: fully visualized. Cervix swabbed x 3 with acetic acid.  Acetowhitening present: Yes-slight ACW changes at 11-12 o'clock c/w koilocytic atypia not requiring bx per LHE Cervix: no visible lesions, no mosaicism, no punctation and no abnormal vasculature. no biopsies taken. Vagina: vaginal colposcopy not performed Vulva: vulvar colposcopy not performed  Specimens: 0  Complications: none  Preceptor: [facility]. [facility] Colposcopic Impression & Plan:   Normal colposcopy without lesions Plan: repeat cotesting in 29yr  Return for after 12/22 for , Pap & physical.  1/23 CNM, [facility] 02/07/2020 12:24 PM

## 2020-02-07 NOTE — Patient Instructions (Signed)

## 2020-09-19 ENCOUNTER — Ambulatory Visit: Payer: 59 | Admitting: Obstetrics & Gynecology

## 2020-09-27 ENCOUNTER — Ambulatory Visit: Payer: 59 | Admitting: Adult Health

## 2021-01-09 ENCOUNTER — Encounter: Payer: Self-pay | Admitting: Women's Health

## 2021-01-09 ENCOUNTER — Other Ambulatory Visit (HOSPITAL_COMMUNITY)
Admission: RE | Admit: 2021-01-09 | Discharge: 2021-01-09 | Disposition: A | Discharge status: Home / Self Care | Payer: 59 | Source: Ambulatory Visit | Attending: Women's Health | Admitting: Women's Health

## 2021-01-09 ENCOUNTER — Other Ambulatory Visit: Payer: Self-pay

## 2021-01-09 ENCOUNTER — Ambulatory Visit (INDEPENDENT_AMBULATORY_CARE_PROVIDER_SITE_OTHER): Payer: 59 | Admitting: Women's Health

## 2021-01-09 VITALS — BP 109/69 | HR 67 | Ht 62.0 in | Wt 154.0 lb

## 2021-01-09 DIAGNOSIS — Z01419 Encounter for gynecological examination (general) (routine) without abnormal findings: Secondary | ICD-10-CM | POA: Diagnosis not present

## 2021-01-09 NOTE — Progress Notes (Signed)
WELL-WOMAN EXAMINATION Patient name: Madison Golden MRN [id number]  Date of birth: 31-Jan-1980 Chief Complaint:   Gynecologic Exam  History of Present Illness:   Madison Golden is a 41 y.o. [phone] Caucasian female being seen today for a routine well-woman exam.  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 States she has had hormones/tsh checked, and all wnl. Has been more stressed and not sleeping as well.  PCP: [facility]      does not desire labs Patient's last menstrual period was 12/07/2020 (within days). The current method of family planning is tubal ligation.  Last pap 01/03/20. Results were: NILM w/ HRHPV positive: 18/45. H/O abnormal pap: yes Last mammogram: 01/26/19 f/u asymmetry upper/inner Lt breast. Results were: normal. Family h/o breast cancer: yes MGM Last colonoscopy: never. Results were: N/A. Family h/o colorectal cancer: no  Depression screen [facility] 2/9 01/09/2021 01/03/2020 12/29/2018 05/22/2017 05/13/2016  Decreased Interest 1 1 0 0 0  Down, Depressed, Hopeless 1 1 0 0 0  PHQ - 2 Score 2 2 0 0 0  Altered sleeping 2 1 - 1 -  Tired, decreased energy 2 1 - 0 -  Change in appetite 2 1 - 0 -  Feeling bad or failure about yourself  1 1 - 0 -  Trouble concentrating 1 0 - 0 -  Moving slowly or fidgety/restless 0 0 - 0 -  Suicidal thoughts 0 0 - 0 -  PHQ-9 Score 10 6 - 1 -  Difficult doing work/chores - Somewhat difficult - Not difficult at all -     GAD 7 : Generalized Anxiety Score 01/09/2021 01/03/2020  Nervous, Anxious, on Edge 2 3  Control/stop worrying 1 3  Worry too much - different things 1 3  Trouble relaxing 1 3  Restless 1 3  Easily annoyed or irritable 1 3  Afraid - awful might happen 1 3  Total GAD 7 Score 8 21  Anxiety Difficulty - Somewhat difficult     Review of Systems:   Pertinent items are noted in HPI Denies any headaches, blurred vision, fatigue, shortness of breath, chest  pain, abdominal pain, abnormal vaginal discharge/itching/odor/irritation, problems with periods, bowel movements, urination, or intercourse unless otherwise stated above. Pertinent History Reviewed:  Reviewed past medical,surgical, social and family history.  Reviewed problem list, medications and allergies. Physical Assessment:   Vitals:   01/09/21 1057  BP: 109/69  Pulse: 67  Weight: 154 lb (69.9 kg)  Height: 5\' 2"  (1.575 m)  Body mass index is 28.17 kg/m.        Physical Examination:   General appearance - well appearing, and in no distress  Mental status - alert, oriented to person, place, and time  Psych:  She has a normal mood and affect  Skin - warm and dry, normal color, no suspicious lesions noted  Chest - effort normal, all lung fields clear to auscultation bilaterally  Heart - normal rate and regular rhythm  Neck:  midline trachea, no thyromegaly or nodules  Breasts - breasts appear normal, no suspicious masses, no skin or nipple changes or  axillary nodes  Abdomen - soft, nontender, nondistended, no masses or organomegaly  Pelvic - VULVA: normal appearing vulva with no masses, tenderness or lesions  VAGINA: normal appearing vagina with normal color and discharge, no lesions  CERVIX: normal appearing cervix without discharge or lesions, no CMT  Thin prep pap is done w/ HR  HPV cotesting  UTERUS: uterus is felt to be normal size, shape, consistency and nontender   ADNEXA: No adnexal masses or tenderness noted.  Extremities:  No swelling or varicosities noted  Chaperone: Faith Rogue    No results found for this or any previous visit (from the past 24 hour(s)).  Assessment & Plan:  1) Well-Woman Exam  2) H/O +HRHPV> pap done today  3) Weight gain> on review of chart, 4lb wt gain since last year's visit. Increase activity, diet changes  Labs/procedures today: pap  Mammogram: @ 39yo, or sooner if problems Colonoscopy: @ 39yo, or sooner if problems  No orders of  the defined types were placed in this encounter.   Meds: No orders of the defined types were placed in this encounter.   Follow-up: Return in about 1 year (around 01/09/2022) for Physical.  Cheral Marker CNM, [facility] 01/09/2021 11:25 AM

## 2021-01-15 LAB — CYTOLOGY - PAP
Comment: NEGATIVE
Comment: NEGATIVE
Diagnosis: UNDETERMINED — AB
HPV 16: NEGATIVE
HPV 18 / 45: POSITIVE — AB
High risk HPV: POSITIVE — AB

## 2021-02-07 ENCOUNTER — Ambulatory Visit (INDEPENDENT_AMBULATORY_CARE_PROVIDER_SITE_OTHER): Payer: 59 | Admitting: Women's Health

## 2021-02-07 ENCOUNTER — Other Ambulatory Visit: Payer: Self-pay

## 2021-02-07 ENCOUNTER — Other Ambulatory Visit (HOSPITAL_COMMUNITY)
Admission: RE | Admit: 2021-02-07 | Discharge: 2021-02-07 | Disposition: A | Discharge status: Home / Self Care | Payer: 59 | Source: Ambulatory Visit | Attending: Women's Health | Admitting: Women's Health

## 2021-02-07 ENCOUNTER — Encounter: Payer: Self-pay | Admitting: Women's Health

## 2021-02-07 VITALS — BP 104/64 | HR 66 | Ht 62.0 in | Wt 155.2 lb

## 2021-02-07 DIAGNOSIS — R87810 Cervical high risk human papillomavirus (HPV) DNA test positive: Secondary | ICD-10-CM | POA: Insufficient documentation

## 2021-02-07 DIAGNOSIS — R87610 Atypical squamous cells of undetermined significance on cytologic smear of cervix (ASC-US): Secondary | ICD-10-CM

## 2021-02-07 DIAGNOSIS — Z3202 Encounter for pregnancy test, result negative: Secondary | ICD-10-CM | POA: Diagnosis not present

## 2021-02-07 DIAGNOSIS — Z01812 Encounter for preprocedural laboratory examination: Secondary | ICD-10-CM

## 2021-02-07 LAB — POCT URINE PREGNANCY: Preg Test, Ur: NEGATIVE

## 2021-02-07 NOTE — Patient Instructions (Signed)
Colposcopy, Care After ?The following information offers guidance on how to care for yourself after your procedure. Your health care provider may also give you more specific instructions. If you have problems or questions, contact your health care provider. ?What can I expect after the procedure? ?If you had a colposcopy without a biopsy, you can expect to feel fine right away after your procedure. However, you may have some spotting of blood for a few days. You can return to your normal activities. ?If you had a colposcopy with a biopsy, it is common after the procedure to have: ?Soreness and mild pain. These may last for a few days. ?Mild vaginal bleeding or discharge that is dark-colored and grainy. This may last for a few days. The discharge may be caused by a liquid (solution) that was used during the procedure. You may need to wear a sanitary pad during this time. ?Spotting of blood for at least 48 hours after the procedure. ?Follow these instructions at home: ?Medicines ?Take over-the-counter and prescription medicines only as told by your health care provider. ?Talk with your health care provider about what type of over-the-counter pain medicines and prescription medicines you can start to take again. It is especially important to talk with your health care provider if you take blood thinners. ?Activity ?Avoid using douche products, using tampons, and having sex for at least 3 days after the procedure or for as long as told by your health care provider. ?Return to your normal activities as told by your health care provider. Ask your health care provider what activities are safe for you. ?General instructions ?Ask your health care provider if you may take baths, swim, or use a hot tub. You may take showers. ?If you use birth control (contraception), continue to use it. ?Keep all follow-up visits. This is important. ?Contact a health care provider if: ?You have a fever or chills. ?You faint or feel  light-headed. ?Get help right away if: ?You have heavy bleeding from your vagina or pass blood clots. Heavy bleeding is bleeding that soaks through a sanitary pad in less than 1 hour. ?You have vaginal discharge that is abnormal, is yellow in color, or smells bad. This could be a sign of infection. ?You have severe pain or cramps in your lower abdomen that do not go away with medicine. ?Summary ?If you had a colposcopy without a biopsy, you can expect to feel fine right away, but you may have some spotting of blood for a few days. You can return to your normal activities. ?If you had a colposcopy with a biopsy, it is common to have mild pain for a few days and spotting for 48 hours after the procedure. ?Avoid using douche products, using tampons, and having sex for at least 3 days after the procedure or for as long as told by your health care provider. ?Get help right away if you have heavy bleeding, severe pain, or signs of infection. ?This information is not intended to replace advice given to you by your health care provider. Make sure you discuss any questions you have with your health care provider. ?Document Revised: 05/21/2020 Document Reviewed: 05/21/2020 ?Elsevier Patient Education ? 2022 [employer] ? ?

## 2021-02-07 NOTE — Progress Notes (Signed)
° °  COLPOSCOPY PROCEDURE NOTE Patient name: Madison Golden MRN [id number]  Date of birth: 10-26-1980 Subjective Findings:   Madison Golden is a 41 y.o. [phone] Caucasian female being seen today for a colposcopy. Indication: Abnormal pap on 01/09/21: ASCUS w/ HRHPV positive: 18/45  Prior cytology:  Date Result Procedure  01/03/20 NILM w/ HRHPV positive: 18/45 Colposcopy: nl, no bx  12/29/18 NILM w/ HRHPV positive: 18/45 Colposcopy: nl, no bx  2012  LEEP      Patient's last menstrual period was 01/16/2020 (approximate). Contraception: tubal ligation. Menopausal: no. Hysterectomy: no.   Smoker: no. Immunocompromised: no.   The risks and benefits were explained and informed consent was obtained, and written copy is in chart. Pertinent History Reviewed:   Reviewed past medical,surgical, social, obstetrical and family history.  Reviewed problem list, medications and allergies. Objective Findings & Procedure:   Vitals:   02/07/21 0850  BP: 104/64  Pulse: 66  Weight: 155 lb 3.2 oz (70.4 kg)  Height: 5\' 2"  (1.575 m)  Body mass index is 28.39 kg/m.  Results for orders placed or performed in visit on 02/07/21 (from the past 24 hour(s))  POCT urine pregnancy   Collection Time: 02/07/21  8:58 AM  Result Value Ref Range   Preg Test, Ur Negative Negative     Time out was performed.  Speculum placed in the vagina, cervix fully visualized. SCJ: fully visualized. Cervix swabbed x 3 with acetic acid.  Acetowhitening present: Yes Cervix: no visible lesions, no mosaicism, no punctation, no abnormal vasculature, and acetowhite lesion(s) noted at 9-12 o'clock. Cervical biopsies taken at 9 & 12 o'clock and Hemostasis achieved with Monsel's solution. Vagina: vaginal colposcopy not performed Vulva: vulvar colposcopy not performed  Specimens: 2  Complications: none  Chaperone: 04/07/21    Colposcopic Impression & Plan:   Colposcopy findings consistent with LSIL Plan: Post biopsy  instructions given, Will notify patient of results when back, and Will base plan of care on pathology results and ASCCP guidelines  Return in about 1 year (around 02/07/2022) for Pap & physical.  04/08/2022 CNM, [facility] 02/07/2021 9:35 AM

## 2021-02-09 ENCOUNTER — Encounter: Payer: Self-pay | Admitting: Women's Health

## 2021-02-09 LAB — SURGICAL PATHOLOGY

## 2021-08-07 IMAGING — MG DIGITAL DIAGNOSTIC BILAT W/ TOMO W/ CAD
8 series · 8 of 24 positions shown · non-contrast
Comparison: Previous examinations, the most recent dated
05/23/2015.

CLINICAL DATA: Follow-up probably benign asymmetry in the upper
inner left breast correlating to an area of soft tissue thickening
with no ultrasound correlate. The patient states that the thickening
is less now. No family history of breast cancer.

EXAM:
DIGITAL DIAGNOSTIC BILATERAL MAMMOGRAM WITH CAD AND TOMO

[R MLO synth-2D]
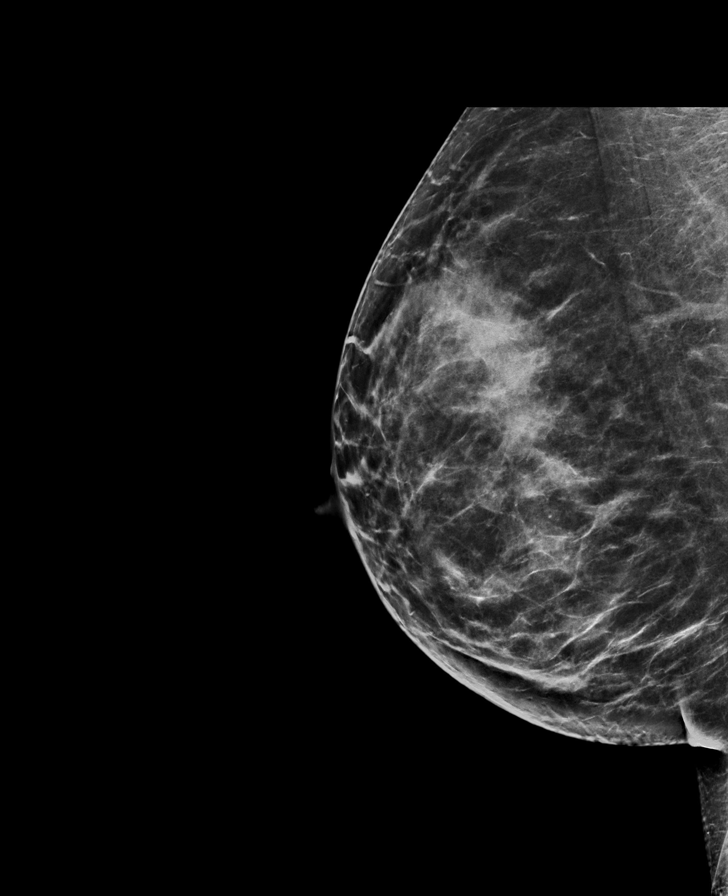

[R CC synth-2D]
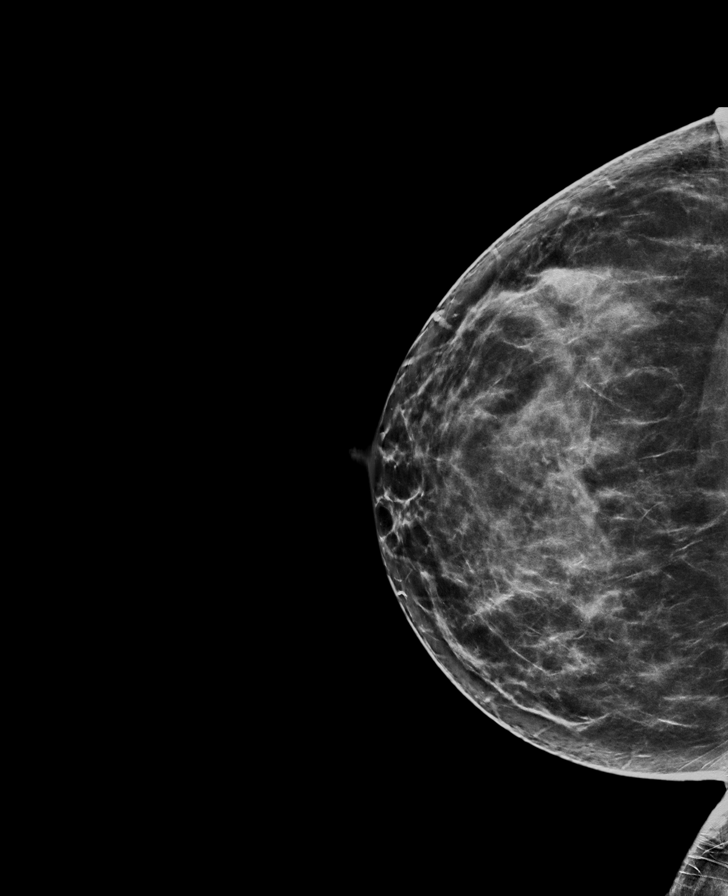

[L CC synth-2D]
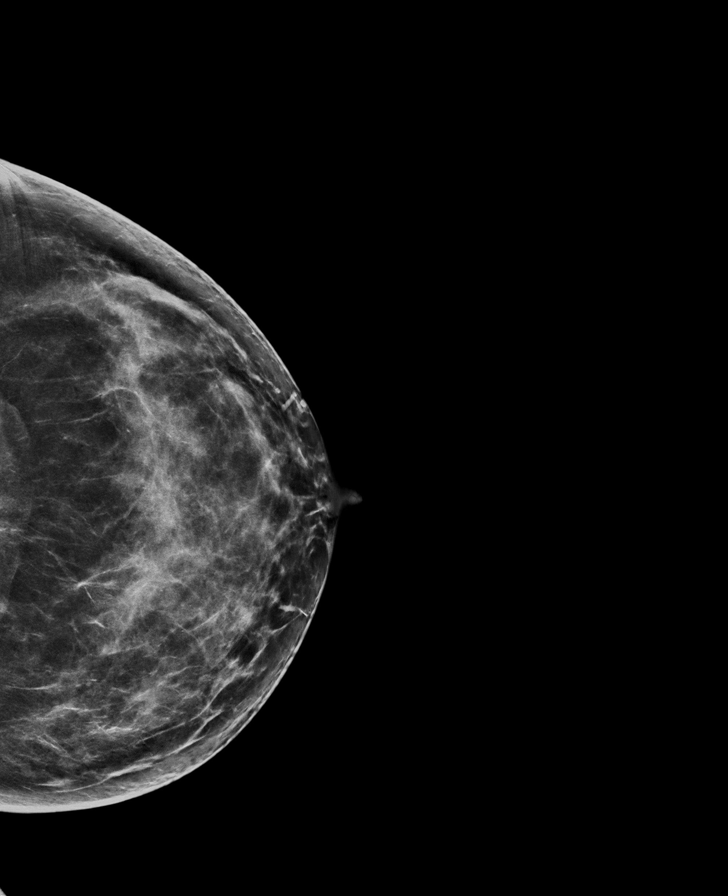

[L MLO synth-2D]
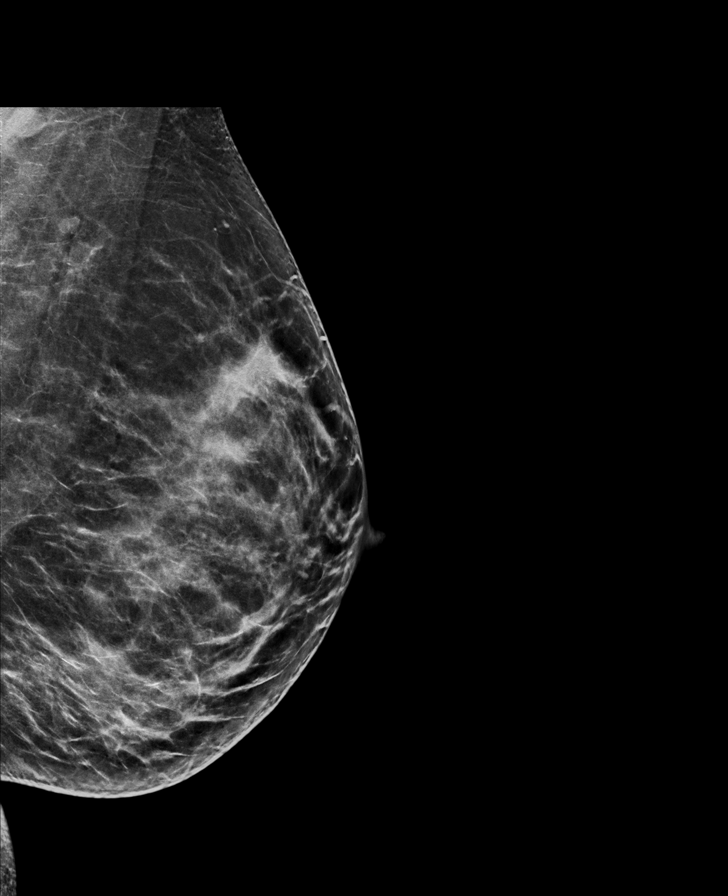

[L CC tomo · tomo slice 40/79.0]
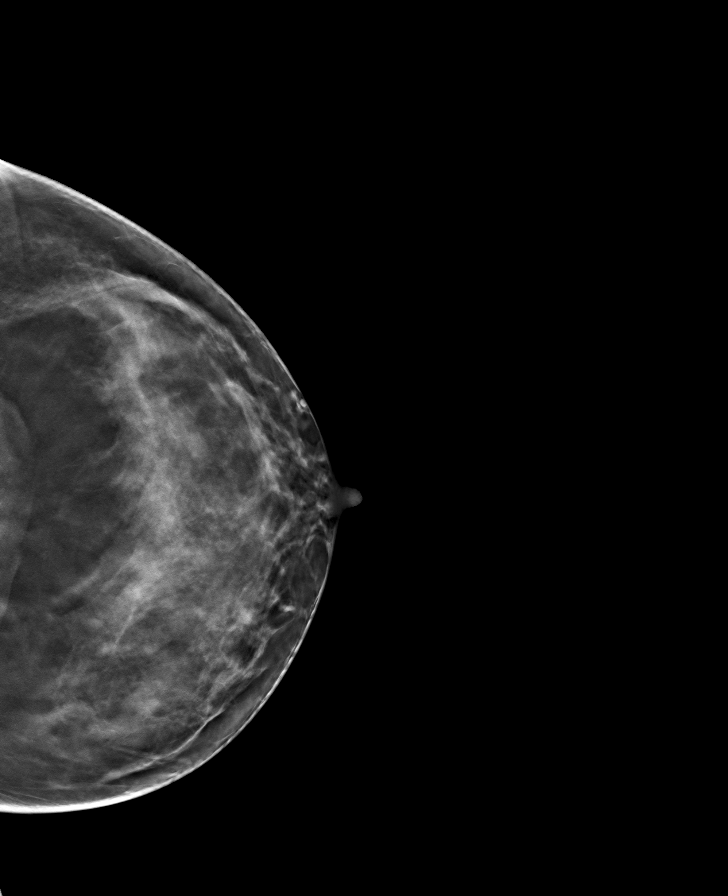

[R MLO tomo · tomo slice 39/77.0]
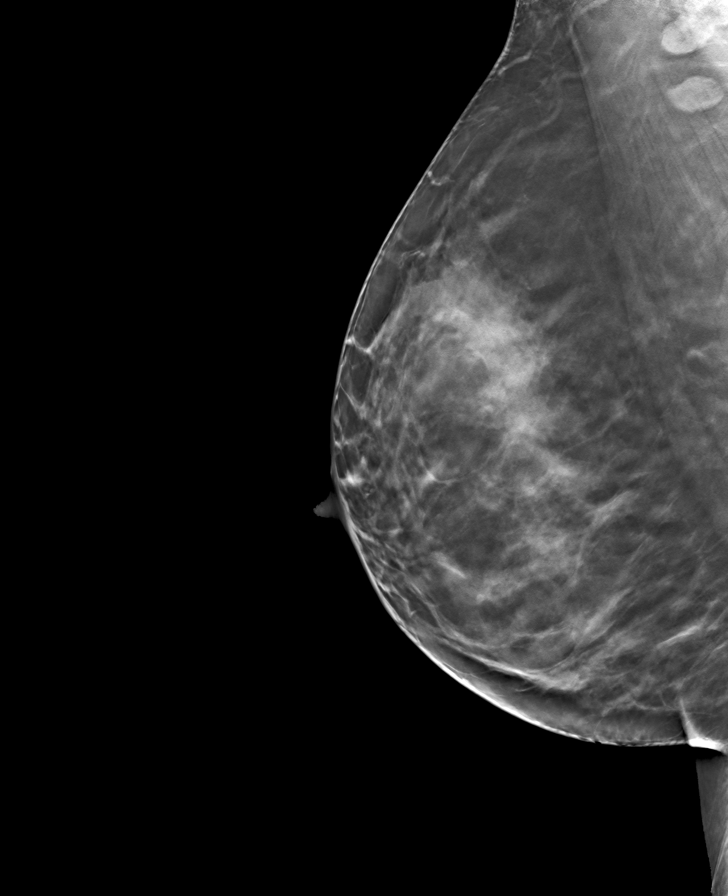

[L MLO tomo · tomo slice 38/75.0]
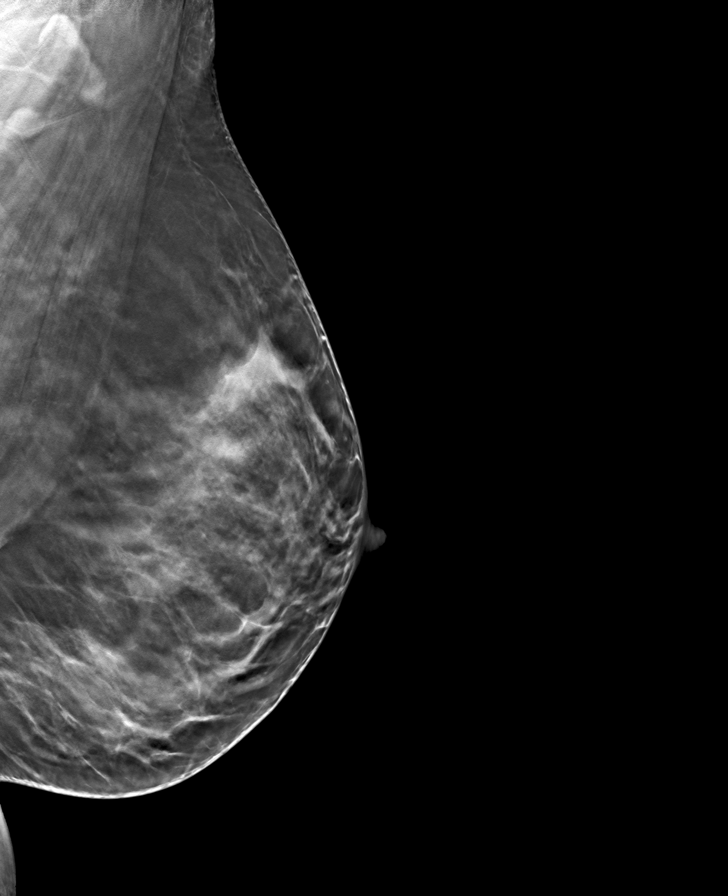

[R CC tomo · tomo slice 40/79.0]
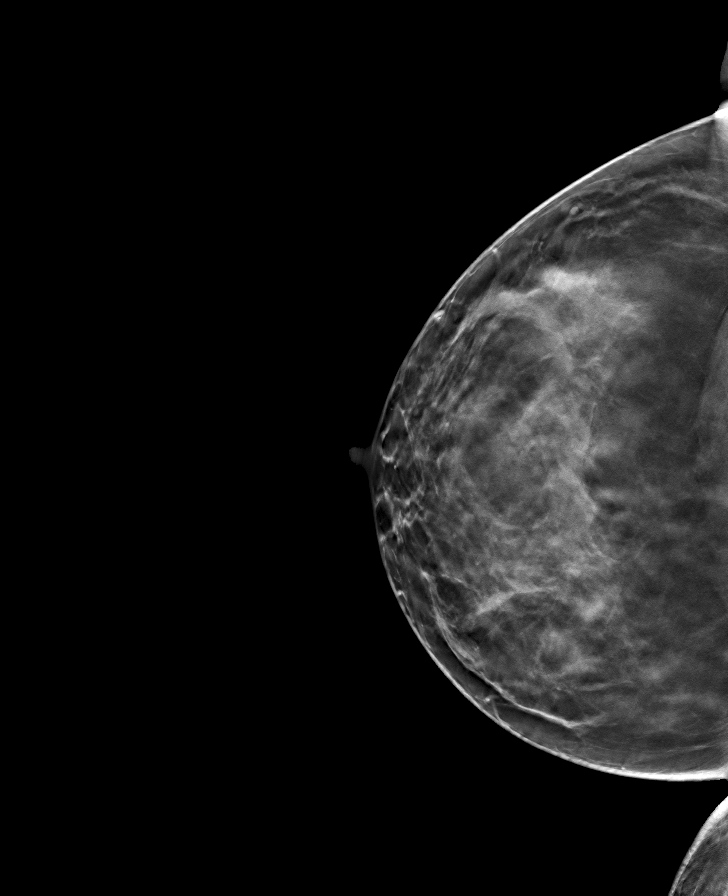

[8 of 24 positions shown; findings below may reference images not displayed]

ACR Breast Density Category c: The breast tissue is heterogeneously
dense, which may obscure small masses.
FINDINGS: Normal appearing fibroglandular tissue at the location of previously
demonstrated asymmetry in the upper inner left breast. No asymmetry
or other findings suspicious for malignancy in either breast.

Mammographic images were processed with CAD.
IMPRESSION: No evidence of malignancy.

RECOMMENDATION:
Annual screening mammography beginning at age 40.

I have discussed the findings and recommendations with the patient.
If applicable, a reminder letter will be sent to the patient
regarding the next appointment.

BI-RADS CATEGORY  1: Negative.

## 2022-01-14 ENCOUNTER — Other Ambulatory Visit (HOSPITAL_COMMUNITY)
Admission: RE | Admit: 2022-01-14 | Discharge: 2022-01-14 | Disposition: A | Discharge status: Home / Self Care | Payer: 59 | Source: Ambulatory Visit | Attending: Women's Health | Admitting: Women's Health

## 2022-01-14 ENCOUNTER — Ambulatory Visit (INDEPENDENT_AMBULATORY_CARE_PROVIDER_SITE_OTHER): Payer: 59 | Admitting: Women's Health

## 2022-01-14 ENCOUNTER — Encounter: Payer: Self-pay | Admitting: Women's Health

## 2022-01-14 VITALS — BP 108/66 | HR 71 | Ht 62.0 in | Wt 157.8 lb

## 2022-01-14 DIAGNOSIS — Z01419 Encounter for gynecological examination (general) (routine) without abnormal findings: Secondary | ICD-10-CM | POA: Diagnosis not present

## 2022-01-14 DIAGNOSIS — Z1231 Encounter for screening mammogram for malignant neoplasm of breast: Secondary | ICD-10-CM

## 2022-01-14 NOTE — Patient Instructions (Signed)
Call [phone] to schedule mammogram for after 2/3

## 2022-01-14 NOTE — Progress Notes (Signed)
WELL-WOMAN EXAMINATION Patient name: Madison Golden MRN [id number]  Date of birth: 02-Mar-1980 Chief Complaint:   Annual Exam  History of Present Illness:   Madison Golden is a 42 y.o. [phone] Caucasian female being seen today for a routine well-woman exam.  Current complaints: none  PCP: [facility]      does not desire labs Patient's last menstrual period was 12/31/2021. The current method of family planning is tubal ligation.  Last pap 01/09/21. Results were: ASCUS w/ HRHPV positive: 18/45. H/O abnormal pap: yes Last mammogram: 2021 d/t asymmetry upper inner Lt breast. Results were: normal. Family h/o breast cancer: yes MGM Last colonoscopy: never. Results were: N/A. Family h/o colorectal cancer: yes MGM     01/14/2022    2:17 PM 01/09/2021   10:59 AM 01/03/2020    2:19 PM 12/29/2018    8:46 AM 05/22/2017    1:50 PM  Depression screen PHQ 2/9  Decreased Interest 2 1 1  0 0  Down, Depressed, Hopeless 1 1 1  0 0  PHQ - 2 Score 3 2 2  0 0  Altered sleeping 1 2 1  1   Tired, decreased energy 2 2 1   0  Change in appetite 1 2 1   0  Feeling bad or failure about yourself  0 1 1  0  Trouble concentrating 1 1 0  0  Moving slowly or fidgety/restless 0 0 0  0  Suicidal thoughts 0 0 0  0  PHQ-9 Score 8 10 6  1   Difficult doing work/chores   Somewhat difficult  Not difficult at all        01/14/2022    2:17 PM 01/09/2021   10:59 AM 01/03/2020    2:21 PM  GAD 7 : Generalized Anxiety Score  Nervous, Anxious, on Edge 2 2 3   Control/stop worrying 2 1 3   Worry too much - different things 2 1 3   Trouble relaxing 2 1 3   Restless 2 1 3   Easily annoyed or irritable 2 1 3   Afraid - awful might happen 1 1 3   Total GAD 7 Score 13 8 21   Anxiety Difficulty   Somewhat difficult     Review of Systems:   Pertinent items are noted in HPI Denies any headaches, blurred vision, fatigue, shortness of breath, chest pain, abdominal pain, abnormal vaginal discharge/itching/odor/irritation, problems with  periods, bowel movements, urination, or intercourse unless otherwise stated above. Pertinent History Reviewed:  Reviewed past medical,surgical, social and family history.  Reviewed problem list, medications and allergies. Physical Assessment:   Vitals:   01/14/22 1423  BP: 108/66  Pulse: 71  Weight: 157 lb 12.8 oz (71.6 kg)  Height: 5\' 2"  (1.575 m)  Body mass index is 28.86 kg/m.        Physical Examination:   General appearance - well appearing, and in no distress  Mental status - alert, oriented to person, place, and time  Psych:  She has a normal mood and affect  Skin - warm and dry, normal color, no suspicious lesions noted  Chest - effort normal, all lung fields clear to auscultation bilaterally  Heart - normal rate and regular rhythm  Neck:  midline trachea, no thyromegaly or nodules  Breasts - breasts appear normal, no suspicious masses, no skin or nipple changes or  axillary nodes  Abdomen - soft, nontender, nondistended, no masses or organomegaly  Pelvic - VULVA: normal appearing vulva with no masses, tenderness or lesions  VAGINA: normal appearing vagina with normal color  and discharge, no lesions  CERVIX: normal appearing cervix without discharge or lesions, no CMT  Thin prep pap is done w/ HR HPV cotesting  UTERUS: uterus is felt to be normal size, shape, consistency and nontender   ADNEXA: No adnexal masses or tenderness noted.  Extremities:  No swelling or varicosities noted  Chaperone: Latisha Cresenzo    No results found for this or any previous visit (from the past 24 hour(s)).  Assessment & Plan:  1) Well-Woman Exam  2) Turning 42yo on 2/3> ordered screening mammo, gave pt number to call and schedule mammo for after her bday  3) H/O abnormal paps> repeat today  Labs/procedures today: pap  Mammogram: schedule for after 2/3 Colonoscopy: @ 40yo, or sooner if problems  Orders Placed This Encounter  Procedures   MM 3D SCREEN BREAST BILATERAL    Meds: No  orders of the defined types were placed in this encounter.   Follow-up: Return in about 1 year (around 01/15/2023) for Physical.  Cheral Marker CNM, [facility] 01/14/2022 2:50 PM

## 2022-01-21 LAB — CYTOLOGY - PAP
Comment: NEGATIVE
Diagnosis: NEGATIVE
Diagnosis: REACTIVE
High risk HPV: NEGATIVE

## 2022-03-27 ENCOUNTER — Ambulatory Visit (HOSPITAL_COMMUNITY): Payer: 59

## 2022-04-01 ENCOUNTER — Ambulatory Visit (HOSPITAL_COMMUNITY): Payer: 59

## 2022-04-15 ENCOUNTER — Ambulatory Visit (HOSPITAL_COMMUNITY)
Admission: RE | Admit: 2022-04-15 | Discharge: 2022-04-15 | Disposition: A | Discharge status: Home / Self Care | Payer: 59 | Source: Ambulatory Visit | Attending: Women's Health | Admitting: Women's Health

## 2022-04-15 DIAGNOSIS — Z1231 Encounter for screening mammogram for malignant neoplasm of breast: Secondary | ICD-10-CM | POA: Diagnosis present

## 2022-12-18 ENCOUNTER — Encounter: Payer: Self-pay | Admitting: Cardiology

## 2022-12-18 DIAGNOSIS — R072 Precordial pain: Secondary | ICD-10-CM | POA: Insufficient documentation

## 2022-12-18 NOTE — Progress Notes (Unsigned)
Cardiology Office Note   Date:  12/19/2022   ID:  Alyeska, Boyette 1980/08/26, MRN [id number]  PCP:  Richardean Chimera, MD  Cardiologist:   Rollene Rotunda, MD Referring:  Richardean Chimera, MD  Chief Complaint  Patient presents with   Palpitations      History of Present Illness: ALIECE Golden is a 42 y.o. female who presents for evaluation of chest pain.  She was referred by Richardean Chimera, MD     She has no past cardiac history other than she wore a monitor in 2021 with rare PVCs.    She wore a monitor and this was placed by her primary provider and I was able to review this on her phone.  She has had a history of SVT but this has been bothering her recently.  She has not been feeling any sustained tachypalpitations.  Rather she has been feeling more isolated skipped beats and her monitor did demonstrate premature ectopic complexes.  Some of these were wide and likely PVCs.  Others were more narrow.  There were no sustained arrhythmias.  There was less than 1% on this monitor but she says she is feeling it daily.  It happens mostly during the day but sometimes at night but usually when she is quiet.  It is less bothersome when she is active.  She cannot find a trigger.  She drinks very little caffeine.  This does not come on with activity.  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  She has no chest discomfort, neck or arm discomfort.  She does not have shortness of breath, PND or orthopnea.  Past Medical History:  Diagnosis Date   Depression    Endometriosis    Mass of breast, left    Panic attacks     Past Surgical History:  Procedure Laterality Date   CESAREAN SECTION     LAPAROSCOPIC ENDOMETRIOSIS FULGURATION  2009   LEEP  2012   TUBAL LIGATION Bilateral 11/10/2012   Procedure: POST PARTUM TUBAL LIGATION;  Surgeon: Willodean Rosenthal, MD;  Location: [facility];  Service: Gynecology;  Laterality:  Bilateral;     Current Outpatient Medications  Medication Sig Dispense Refill   ALPRAZolam (XANAX) 0.5 MG tablet Take 0.25 mg by mouth at bedtime as needed for anxiety.     fluticasone (FLONASE) 50 MCG/ACT nasal spray Place into both nostrils daily.     levalbuterol (XOPENEX HFA) 45 MCG/ACT inhaler Inhale 2 puffs into the lungs.     propranolol (INDERAL) 20 MG tablet Take 1 tablet (20 mg total) by mouth 2 (two) times daily as needed. 60 tablet 11   valACYclovir (VALTREX) 1000 MG tablet Take 2,000 mg by mouth 2 (two) times daily as needed.     No current facility-administered medications for this visit.    Allergies:   Patient has no known allergies.    Social History:  The patient  reports that she has never smoked. She has never used smokeless tobacco. She reports that she does not drink alcohol and does not use drugs.   Family History:  The patient's family history includes Cancer in her maternal grandmother; Cirrhosis in her maternal grandfather; Colon polyps in her mother; Diabetes in her maternal grandmother; Hypertension in her maternal grandmother; Stroke in her paternal grandmother.    ROS:  Please see the history of present illness.   Otherwise, review  of systems are positive for none.   All other systems are reviewed and negative.    PHYSICAL EXAM: VS:  BP 120/84 (BP Location: Left Arm, Patient Position: Sitting, Cuff Size: Normal)   Pulse 75   Ht 5\' 2"  (1.575 m)   Wt 154 lb (69.9 kg)   SpO2 96%   BMI 28.17 kg/m  , BMI Body mass index is 28.17 kg/m. GENERAL:  Well appearing HEENT:  Pupils equal round and reactive, fundi not visualized, oral mucosa unremarkable NECK:  No jugular venous distention, waveform within normal limits, carotid upstroke brisk and symmetric, no bruits, no thyromegaly LYMPHATICS:  No cervical, inguinal adenopathy LUNGS:  Clear to auscultation bilaterally BACK:  No CVA tenderness CHEST:  Unremarkable HEART:  PMI not displaced or sustained,S1  and S2 within normal limits, no S3, no S4, no clicks, no rubs, very soft early left upper sternal border systolic murmur, no diastolic murmurs ABD:  Flat, positive bowel sounds normal in frequency in pitch, no bruits, no rebound, no guarding, no midline pulsatile mass, no hepatomegaly, no splenomegaly EXT:  2 plus pulses throughout, no edema, no cyanosis no clubbing SKIN:  No rashes no nodules NEURO:  Cranial nerves II through XII grossly intact, motor grossly intact throughout [facility]:  Cognitively intact, oriented to person place and time    EKG:  EKG Interpretation Date/Time:  Thursday December 19 2022 13:27:41 EST Ventricular Rate:  75 PR Interval:  164 QRS Duration:  84 QT Interval:  386 QTC Calculation: 431 R Axis:   -23  Text Interpretation: Normal sinus rhythm RSR' or QR pattern in V1 suggests right ventricular conduction delay No previous ECGs available Confirmed by Rollene Rotunda ([id number]) on 12/19/2022 1:29:40 PM     Recent Labs: No results found for requested labs within last 365 days.    Lipid Panel    Component Value Date/Time   CHOL 165 03/30/2013 1048   TRIG 48 03/30/2013 1048   HDL 59 03/30/2013 1048   CHOLHDL 2.8 03/30/2013 1048   VLDL 10 03/30/2013 1048   LDLCALC 96 03/30/2013 1048      Wt Readings from Last 3 Encounters:  12/19/22 154 lb (69.9 kg)  01/14/22 157 lb 12.8 oz (71.6 kg)  02/07/21 155 lb 3.2 oz (70.4 kg)      Other studies Reviewed: Additional studies/ records that were reviewed today include: Labs,  monitor result. Review of the above records demonstrates:  Please see elsewhere in the note.     ASSESSMENT AND PLAN:  Palpitations: These are documented to be premature ectopic beats but they are particularly symptomatic.  She has had normal electrolytes and TSH.  I reviewed the monitor.  She does have a very slight murmur that I think is probably a flow murmur but I am going to check an echocardiogram.  I will also give her a prescription  for propranolol 20 mg as needed and she will let me know how she does with this.  No further testing at this point.  Chest pain: This was mentioned in her referring request but she is not reporting significant chest discomfort.  Risk reduction: She said her mom was just diagnosed with atherosclerotic disease.  I would like her to have a fasting lipid profile and LP(a).   Current medicines are reviewed at length with the patient today.  The patient does not have concerns regarding medicines.  The following changes have been made:  As above  Labs/ tests ordered today include:   Orders Placed  This Encounter  Procedures   Lipid panel   Lipoprotein A (LPA)   EKG 12-Lead   ECHOCARDIOGRAM COMPLETE     Disposition:   FU with with me as needed.     Signed, Rollene Rotunda, MD  12/19/2022 2:12 PM    [facility]

## 2022-12-19 ENCOUNTER — Ambulatory Visit: Payer: 59 | Attending: Cardiology | Admitting: Cardiology

## 2022-12-19 ENCOUNTER — Encounter: Payer: Self-pay | Admitting: Cardiology

## 2022-12-19 VITALS — BP 120/84 | HR 75 | Ht 62.0 in | Wt 154.0 lb

## 2022-12-19 DIAGNOSIS — R072 Precordial pain: Secondary | ICD-10-CM | POA: Diagnosis not present

## 2022-12-19 DIAGNOSIS — R002 Palpitations: Secondary | ICD-10-CM

## 2022-12-19 MED ORDER — PROPRANOLOL HCL 20 MG PO TABS: 20.0000 mg | ORAL_TABLET | Freq: Two times a day (BID) | ORAL | 11 refills | Status: AC | PRN

## 2022-12-19 NOTE — Patient Instructions (Signed)
Medication Instructions:  Start propanolol 20 mg by mouth twice per day as needed. New script sent. *If you need a refill on your cardiac medications before your next appointment, please call your pharmacy*   Lab Work: Fasting Lipid and Lpa at earliest convenience.  If you have labs (blood work) drawn today and your tests are completely normal, you will receive your results only by: MyChart Message (if you have MyChart) OR A paper copy in the mail If you have any lab test that is abnormal or we need to change your treatment, we will call you to review the results.   Testing/Procedures: Your physician has requested that you have an echocardiogram. Echocardiography is a painless test that uses sound waves to create images of your heart. It provides your doctor with information about the size and shape of your heart and how well your heart's chambers and valves are working. This procedure takes approximately one hour. There are no restrictions for this procedure. Please do NOT wear cologne, perfume, aftershave, or lotions (deodorant is allowed). Please arrive 15 minutes prior to your appointment time. [address].  Please note: We ask at that you not bring children with you during ultrasound (echo/ vascular) testing. Due to room size and safety concerns, children are not allowed in the ultrasound rooms during exams. Our front office staff cannot provide observation of children in our lobby area while testing is being conducted. An adult accompanying a patient to their appointment will only be allowed in the ultrasound room at the discretion of the ultrasound technician under special circumstances. We apologize for any inconvenience.    Follow-Up: At [facility], you and your health needs are our priority.  As part of our continuing mission to provide you with exceptional heart care, we have created designated Provider Care Teams.  These Care Teams include your primary Cardiologist  (physician) and Advanced Practice Providers (APPs -  Physician Assistants and Nurse Practitioners) who all work together to provide you with the care you need, when you need it.  We recommend signing up for the patient portal called "MyChart".  Sign up information is provided on this After Visit Summary.  MyChart is used to connect with patients for Virtual Visits (Telemedicine).  Patients are able to view lab/test results, encounter notes, upcoming appointments, etc.  Non-urgent messages can be sent to your provider as well.   To learn more about what you can do with MyChart, go to [email or web address].    Your next appointment:    As needed.   Provider:   Rollene Rotunda, MD

## 2023-01-03 ENCOUNTER — Ambulatory Visit (HOSPITAL_BASED_OUTPATIENT_CLINIC_OR_DEPARTMENT_OTHER): Payer: 59

## 2023-01-03 DIAGNOSIS — R072 Precordial pain: Secondary | ICD-10-CM

## 2023-01-03 DIAGNOSIS — R002 Palpitations: Secondary | ICD-10-CM | POA: Diagnosis not present

## 2023-01-03 LAB — ECHOCARDIOGRAM COMPLETE
AR max vel: 2.29 cm2
AV Area VTI: 2.27 cm2
AV Area mean vel: 2.33 cm2
AV Mean grad: 3.5 mm[Hg]
AV Peak grad: 6.7 mm[Hg]
Ao pk vel: 1.3 m/s
Area-P 1/2: 4.21 cm2
S' Lateral: 3.46 cm

## 2023-01-06 ENCOUNTER — Encounter: Payer: Self-pay | Admitting: Cardiology

## 2023-01-27 ENCOUNTER — Encounter: Payer: Self-pay | Admitting: *Deleted

## 2023-01-27 ENCOUNTER — Ambulatory Visit: Payer: No Typology Code available for payment source | Admitting: Women's Health

## 2023-01-29 LAB — LIPID PANEL
Chol/HDL Ratio: 3.8 {ratio} (ref 0.0–4.4)
Cholesterol, Total: 166 mg/dL (ref 100–199)
HDL: 44 mg/dL (ref >39)
LDL Chol Calc (NIH): 104 mg/dL — ABNORMAL HIGH (ref 0–99)
Triglycerides: 100 mg/dL (ref 0–149)
VLDL Cholesterol Cal: 18 mg/dL (ref 5–40)

## 2023-01-29 LAB — LIPOPROTEIN A (LPA): Lipoprotein (a): 27.9 nmol/L (ref <75.0)

## 2023-02-12 ENCOUNTER — Encounter: Payer: Self-pay | Admitting: Women's Health

## 2023-02-12 ENCOUNTER — Ambulatory Visit: Payer: No Typology Code available for payment source | Admitting: Women's Health

## 2023-02-12 ENCOUNTER — Other Ambulatory Visit (HOSPITAL_COMMUNITY)
Admission: RE | Admit: 2023-02-12 | Discharge: 2023-02-12 | Disposition: A | Discharge status: Home / Self Care | Payer: No Typology Code available for payment source | Source: Ambulatory Visit | Attending: Women's Health | Admitting: Women's Health

## 2023-02-12 VITALS — BP 106/64 | HR 66 | Ht 62.0 in | Wt 163.0 lb

## 2023-02-12 DIAGNOSIS — Z01419 Encounter for gynecological examination (general) (routine) without abnormal findings: Secondary | ICD-10-CM | POA: Diagnosis present

## 2023-02-12 DIAGNOSIS — K5900 Constipation, unspecified: Secondary | ICD-10-CM | POA: Diagnosis not present

## 2023-02-12 DIAGNOSIS — Z1231 Encounter for screening mammogram for malignant neoplasm of breast: Secondary | ICD-10-CM

## 2023-02-12 NOTE — Patient Instructions (Signed)
Constipation  Drink plenty of fluid, preferably water, throughout the day  Eat foods high in fiber such as fruits, vegetables, and grains  Exercise, such as walking, is a good way to keep your bowels regular  Drink warm fluids, especially warm prune juice, or decaf coffee  Eat a 1/2 cup of real oatmeal (not instant), 1/2 cup applesauce, and 1/2-1 cup warm prune juice every day  If needed, you may take Colace (docusate sodium) stool softener once or twice a day to help keep the stool soft. If you are pregnant, wait until you are out of your first trimester (12-14 weeks of pregnancy)  If you still are having problems with constipation, you may take Miralax once daily as needed to help keep your bowels regular.  If you are pregnant, wait until you are out of your first trimester (12-14 weeks of pregnancy)    

## 2023-02-12 NOTE — Progress Notes (Signed)
 WELL-WOMAN EXAMINATION Patient name: Madison Golden MRN [id number]  Date of birth: November 09, 1980 Chief Complaint:   Annual Exam  History of Present Illness:   Madison Golden is a 43 y.o. [phone] Caucasian female being seen today for a routine well-woman exam.  Current complaints: lots of PVCs, had appt w/ cardiology, rx'd propranolol - didn't start b/c bp && hr always run low, did start a supplement that has helped.  Constipation  PCP: [facility]      Patient's last menstrual period was 02/02/2023. The current method of family planning is tubal ligation.  Last pap 01/14/22. Results were: NILM w/ HRHPV negative. H/O abnormal pap: yes, wants yearly paps Last mammogram: 04/15/22. Results were: normal. Family h/o breast cancer: yes MGM Last colonoscopy: never. Results were: N/A. Family h/o colorectal cancer: yes MGM     02/12/2023   10:50 AM 01/14/2022    2:17 PM 01/09/2021   10:59 AM 01/03/2020    2:19 PM 12/29/2018    8:46 AM  Depression screen PHQ 2/9  Decreased Interest 1 2 1 1  0  Down, Depressed, Hopeless 1 1 1 1  0  PHQ - 2 Score 2 3 2 2  0  Altered sleeping 1 1 2 1    Tired, decreased energy 1 2 2 1    Change in appetite 0 1 2 1    Feeling bad or failure about yourself  0 0 1 1   Trouble concentrating 0 1 1 0   Moving slowly or fidgety/restless 0 0 0 0   Suicidal thoughts 0 0 0 0   PHQ-9 Score 4 8 10 6    Difficult doing work/chores    Somewhat difficult         02/12/2023   10:51 AM 01/14/2022    2:17 PM 01/09/2021   10:59 AM 01/03/2020    2:21 PM  GAD 7 : Generalized Anxiety Score  Nervous, Anxious, on Edge 2 2 2 3   Control/stop worrying 2 2 1 3   Worry too much - different things 2 2 1 3   Trouble relaxing 2 2 1 3   Restless 2 2 1 3   Easily annoyed or irritable 2 2 1 3   Afraid - awful might happen 2 1 1 3   Total GAD 7 Score 14 13 8 21   Anxiety Difficulty    Somewhat difficult     Review of Systems:   Pertinent items are noted in HPI Denies any headaches, blurred vision,  fatigue, shortness of breath, chest pain, abdominal pain, abnormal vaginal discharge/itching/odor/irritation, problems with periods, bowel movements, urination, or intercourse unless otherwise stated above. Pertinent History Reviewed:  Reviewed past medical,surgical, social and family history.  Reviewed problem list, medications and allergies. Physical Assessment:   Vitals:   02/12/23 1045  BP: 106/64  Pulse: 66  Weight: 163 lb (73.9 kg)  Height: 5' 2 (1.575 m)  Body mass index is 29.81 kg/m.        Physical Examination:   General appearance - well appearing, and in no distress  Mental status - alert, oriented to person, place, and time  Psych:  She has a normal mood and affect  Skin - warm and dry, normal color, no suspicious lesions noted  Chest - effort normal, all lung fields clear to auscultation bilaterally  Heart - normal rate and regular rhythm  Neck:  midline trachea, no thyromegaly or nodules  Breasts - breasts appear normal, no suspicious masses, no skin or nipple changes or  axillary nodes  Abdomen - soft, nontender,  nondistended, no masses or organomegaly  Pelvic - VULVA: normal appearing vulva with no masses, tenderness or lesions  VAGINA: normal appearing vagina with normal color and discharge, no lesions  CERVIX: normal appearing cervix without discharge or lesions, no CMT  Thin prep pap is done w/ HR HPV cotesting  UTERUS: uterus is felt to be normal size, shape, consistency and nontender   ADNEXA: No adnexal masses or tenderness noted.  Extremities:  No swelling or varicosities noted  Chaperone: Latisha Cresenzo    No results found for this or any previous visit (from the past 24 hours).  Assessment & Plan:  1) Well-Woman Exam  2) H/o abnormal pap> wants yearly paps  3) Constipation> gave printed prevention/relief measures   Labs/procedures today: pap  Mammogram:  screening ordered for after 4/8, pt to call and schedule Colonoscopy: @ 43yo, or sooner if  problems  Orders Placed This Encounter  Procedures   MM 3D SCREENING MAMMOGRAM BILATERAL BREAST    Meds: No orders of the defined types were placed in this encounter.   Follow-up: Return in about 1 year (around 02/12/2024) for Pap & physical.  Suzen JONELLE Fetters CNM, WHNP-BC 02/12/2023 11:12 AM

## 2023-02-12 NOTE — Addendum Note (Signed)
 Addended by: Myrl Askew on: 02/12/2023 11:33 AM   Modules accepted: Orders

## 2023-02-19 ENCOUNTER — Encounter: Payer: Self-pay | Admitting: Women's Health

## 2023-02-19 LAB — CYTOLOGY - PAP
Comment: NEGATIVE
Diagnosis: NEGATIVE
High risk HPV: NEGATIVE

## 2024-03-09 ENCOUNTER — Ambulatory Visit: Admitting: Women's Health
